# Patient Record
Sex: Female | Born: 1960
Health system: Southern US, Community
[De-identification: ages and names within clinical notes are randomized; demographics above are authoritative.]

## PROBLEM LIST (undated history)

## (undated) DIAGNOSIS — E039 Hypothyroidism, unspecified: Secondary | ICD-10-CM

## (undated) DIAGNOSIS — E7800 Pure hypercholesterolemia, unspecified: Secondary | ICD-10-CM

## (undated) DIAGNOSIS — F32A Depression, unspecified: Secondary | ICD-10-CM

## (undated) DIAGNOSIS — F329 Major depressive disorder, single episode, unspecified: Secondary | ICD-10-CM

## (undated) DIAGNOSIS — K219 Gastro-esophageal reflux disease without esophagitis: Secondary | ICD-10-CM

## (undated) HISTORY — PX: BACK SURGERY: SHX140

## (undated) HISTORY — PX: GASTRIC BYPASS: SHX52

## (undated) HISTORY — PX: BREAST REDUCTION SURGERY: SHX8

## (undated) HISTORY — PX: ABDOMINOPLASTY: SUR9

---

## 2013-07-12 ENCOUNTER — Ambulatory Visit: Payer: Self-pay

## 2013-07-12 LAB — RAPID STREP-A WITH REFLX: Micro Text Report: NEGATIVE

## 2013-07-15 LAB — BETA STREP CULTURE(ARMC)

## 2013-10-22 ENCOUNTER — Ambulatory Visit: Payer: Self-pay | Admitting: Physician Assistant

## 2014-04-20 DIAGNOSIS — R9389 Abnormal findings on diagnostic imaging of other specified body structures: Secondary | ICD-10-CM | POA: Insufficient documentation

## 2014-04-20 DIAGNOSIS — F319 Bipolar disorder, unspecified: Secondary | ICD-10-CM | POA: Insufficient documentation

## 2014-04-20 DIAGNOSIS — G4700 Insomnia, unspecified: Secondary | ICD-10-CM | POA: Insufficient documentation

## 2014-04-20 DIAGNOSIS — F32A Depression, unspecified: Secondary | ICD-10-CM | POA: Insufficient documentation

## 2014-08-01 DIAGNOSIS — G3184 Mild cognitive impairment, so stated: Secondary | ICD-10-CM | POA: Insufficient documentation

## 2014-11-11 ENCOUNTER — Ambulatory Visit
Admit: 2014-11-11 | Disposition: A | Discharge status: Home / Self Care | Payer: Self-pay | Attending: Family Medicine | Admitting: Family Medicine

## 2014-11-11 LAB — COMPREHENSIVE METABOLIC PANEL
ALT: 25 U/L
AST: 30 U/L
Albumin: 3.5 g/dL
Alkaline Phosphatase: 86 U/L
Anion Gap: 8 (ref 7–16)
BUN: 22 mg/dL — AB
Bilirubin,Total: 0.5 mg/dL
CHLORIDE: 105 mmol/L
CO2: 25 mmol/L
Calcium, Total: 8.7 mg/dL — ABNORMAL LOW
Creatinine: 0.79 mg/dL
Glucose: 131 mg/dL — ABNORMAL HIGH
Potassium: 3.7 mmol/L
SODIUM: 138 mmol/L
Total Protein: 6.3 g/dL — ABNORMAL LOW

## 2014-11-11 LAB — CBC WITH DIFFERENTIAL/PLATELET
BASOS ABS: 0 10*3/uL (ref 0.0–0.1)
Basophil %: 0.4 %
Eosinophil #: 0.1 10*3/uL (ref 0.0–0.7)
Eosinophil %: 1 %
HCT: 39.3 % (ref 35.0–47.0)
HGB: 13.3 g/dL (ref 12.0–16.0)
LYMPHS ABS: 0.7 10*3/uL — AB (ref 1.0–3.6)
LYMPHS PCT: 8.5 %
MCH: 31 pg (ref 26.0–34.0)
MCHC: 33.8 g/dL (ref 32.0–36.0)
MCV: 92 fL (ref 80–100)
MONOS PCT: 2.2 %
Monocyte #: 0.2 x10 3/mm (ref 0.2–0.9)
NEUTROS ABS: 7.2 10*3/uL — AB (ref 1.4–6.5)
Neutrophil %: 87.9 %
PLATELETS: 253 10*3/uL (ref 150–440)
RBC: 4.28 10*6/uL (ref 3.80–5.20)
RDW: 14.3 % (ref 11.5–14.5)
WBC: 8.2 10*3/uL (ref 3.6–11.0)

## 2015-02-22 ENCOUNTER — Ambulatory Visit
Admission: EM | Admit: 2015-02-22 | Discharge: 2015-02-22 | Disposition: A | Discharge status: Home / Self Care | Payer: BLUE CROSS/BLUE SHIELD | Attending: Internal Medicine | Admitting: Internal Medicine

## 2015-02-22 DIAGNOSIS — H9201 Otalgia, right ear: Secondary | ICD-10-CM | POA: Diagnosis not present

## 2015-02-22 HISTORY — DX: Gastro-esophageal reflux disease without esophagitis: K21.9

## 2015-02-22 HISTORY — DX: Pure hypercholesterolemia, unspecified: E78.00

## 2015-02-22 HISTORY — DX: Depression, unspecified: F32.A

## 2015-02-22 HISTORY — DX: Major depressive disorder, single episode, unspecified: F32.9

## 2015-02-22 HISTORY — DX: Hypothyroidism, unspecified: E03.9

## 2015-02-22 MED ORDER — PREDNISONE 50 MG PO TABS: 50.0000 mg | ORAL_TABLET | Freq: Every day | ORAL | Status: DC

## 2015-02-22 MED ORDER — DOXYCYCLINE HYCLATE 100 MG PO CAPS: 100.0000 mg | ORAL_CAPSULE | Freq: Two times a day (BID) | ORAL | Status: DC

## 2015-02-22 NOTE — ED Notes (Signed)
C/o right ear pain x 6 days with pain below the ear. States has had "many ear infections"

## 2015-02-22 NOTE — ED Provider Notes (Signed)
CSN: [id number]     Arrival date & time 02/22/15  1703 History   First MD Initiated Contact with Patient 02/22/15 1844     Chief Complaint  Patient presents with  . Otalgia   HPI Patient is a 54 year old lady who presents today with a 6 day history of right earache that has become severe in the last day or so. She says that it hurts all the time, but she has episodes of severe stabbing pain that are very distracting. 800 mg ibuprofen of some help. No fever. Pain radiates forward over the upper right part of the face and head. No rash. Patient has had a shingles vaccine. No drainage from the ear, although she feels like there might be some postnasal drainage. No loss of hearing, no ringing in the ear. Does not feel congested. Throat is a little bit scratchy, not really sore. No nausea/vomiting/diarrhea. She is not achy, and feels well otherwise. She is traveling out of town in 2 days.  Past Medical History  Diagnosis Date  . Depression   . Hypothyroidism   . Hypercholesteremia   . GERD (gastroesophageal reflux disease)    Past Surgical History  Procedure Laterality Date  . Gastric bypass      2006  . Breast reduction surgery     Family History  Problem Relation Age of Onset  . Cancer Mother   . Cancer Father    Social History  Substance Use Topics  . Smoking status: Former Games developer  . Smokeless tobacco: None  . Alcohol Use: Yes     Comment: socially           Review of Systems  All other systems reviewed and are negative.   Allergies  Codeine; Morphine and related; and Ciprofloxacin  Home Medications   Prior to Admission medications   Medication Sig Start Date End Date Taking? Authorizing Provider  atorvastatin (LIPITOR) 40 MG tablet Take 40 mg by mouth daily.   Yes Historical Provider, MD  calcium-vitamin D (OSCAL WITH D) 250-125 MG-UNIT per tablet Take 1 tablet by mouth daily.   Yes Historical Provider, MD  esomeprazole (NEXIUM) 40 MG capsule Take 40 mg by mouth  daily at 12 noon.   Yes Historical Provider, MD  lamoTRIgine (LAMICTAL) 150 MG tablet Take 300 mg by mouth daily.   Yes Historical Provider, MD  levothyroxine (SYNTHROID, LEVOTHROID) 125 MCG tablet Take 125 mcg by mouth daily before breakfast.   Yes Historical Provider, MD  Multiple Vitamin (MULTIVITAMIN) capsule Take 1 capsule by mouth daily.   Yes Historical Provider, MD  traZODone (DESYREL) 50 MG tablet Take 25 mg by mouth at bedtime.   Yes Historical Provider, MD  venlafaxine (EFFEXOR) 100 MG tablet Take 187.5 mg by mouth 1 day or 1 dose.   Yes Historical Provider, MD                 BP 116/82 mmHg  Pulse 73  Temp(Src) 96.9 F (36.1 C) (Tympanic)  Resp 18  Ht  (1.676 m)  Wt 148 lb (67.132 kg)  BMI 23.90 kg/m2  SpO2 100% Physical Exam  Constitutional: She is oriented to person, place, and time. No distress.  Alert, nicely groomed  HENT:  Head: Atraumatic.  Bilateral TMs are mildly dull, no erythema There is discomfort with manipulation of the right outer ear, but not the left Floor of the right ear canal may be very slightly inflamed/red, but no debris in the canal No rash on the  right side of the face or scalp Mild nasal congestion Throat is somewhat red   Eyes:  Conjugate gaze, no eye redness/drainage  Neck: Neck supple.  Cardiovascular: Normal rate and regular rhythm.   Pulmonary/Chest: No respiratory distress. She has no wheezes. She has no rales.  Lungs clear, symmetric breath sounds  Abdominal: She exhibits no distension.  Musculoskeletal: Normal range of motion.  No leg swelling  Neurological: She is alert and oriented to person, place, and time.  Skin: Skin is warm and dry.  No cyanosis  Nursing note and vitals reviewed.   ED Course  Procedures  None MDM   1. Acute otalgia, right    differential diagnosis includes upper respiratory infection, sinus or ear infection, early shingles, temporal arteritis, tic douloureux.  Discharge Medication List as  of 02/22/2015  6:58 PM    START taking these medications   Details  doxycycline (VIBRAMYCIN) 100 MG capsule Take 1 capsule (100 mg total) by mouth 2 (two) times daily., Starting 02/22/2015, Until Discontinued, Normal    predniSONE (DELTASONE) 50 MG tablet Take 1 tablet (50 mg total) by mouth daily., Starting 02/22/2015, Until Discontinued, Normal       Prescriptions as above sent to the pharmacy. Patient should follow-up quickly if rash develops area of headache, as treatment for shingles is most effective this started within 1-2 days of the rash appearance.     Eustace Moore, MD 02/22/15 [phone]

## 2015-02-22 NOTE — Discharge Instructions (Signed)
Prescriptions for prednisone and doxycycline (antibiotic) were sent to the pharmacy, for possible ear infection. Shingles can also cause R head/facial pain, and the pain may start several days before rash appears.   Treatment for shingles is most effective if started within 1-2 days of the rash appearing.

## 2016-03-22 ENCOUNTER — Encounter: Payer: Self-pay | Admitting: Gynecology

## 2016-03-22 ENCOUNTER — Ambulatory Visit
Admission: EM | Admit: 2016-03-22 | Discharge: 2016-03-22 | Disposition: A | Discharge status: Home / Self Care | Payer: BLUE CROSS/BLUE SHIELD | Attending: Emergency Medicine | Admitting: Emergency Medicine

## 2016-03-22 DIAGNOSIS — N390 Urinary tract infection, site not specified: Secondary | ICD-10-CM

## 2016-03-22 LAB — URINALYSIS COMPLETE WITH MICROSCOPIC (ARMC ONLY)
Bilirubin Urine: NEGATIVE
Glucose, UA: NEGATIVE mg/dL
Ketones, ur: NEGATIVE mg/dL
Nitrite: NEGATIVE
Protein, ur: NEGATIVE mg/dL
Specific Gravity, Urine: 1.02 (ref 1.005–1.030)
pH: 5.5 (ref 5.0–8.0)

## 2016-03-22 MED ORDER — NITROFURANTOIN MONOHYD MACRO 100 MG PO CAPS: 100.0000 mg | ORAL_CAPSULE | Freq: Two times a day (BID) | ORAL | 0 refills | Status: DC

## 2016-03-22 NOTE — ED Provider Notes (Signed)
CSN: [id number]     Arrival date & time 03/22/16  1541 History   First MD Initiated Contact with Patient 03/22/16 1631     Chief Complaint  Patient presents with  . Urinary Tract Infection   (Consider location/radiation/quality/duration/timing/severity/associated sxs/prior Treatment) HPI  This 55 year old female who presents with symptoms that she thinks is a UTI. Is been having urgency frequency but not exactly dysuria saying that is more of an itching sensation. She denies any vaginal discharge. States this is been a long time since her previous urinary tract infections. She denies any fever or chills. She has had some back pain but that is chronic. Symptoms have been present for 4 days.      Past Medical History:  Diagnosis Date  . Depression   . GERD (gastroesophageal reflux disease)   . Hypercholesteremia   . Hypothyroidism    Past Surgical History:  Procedure Laterality Date  . BREAST REDUCTION SURGERY    . GASTRIC BYPASS     2006   Family History  Problem Relation Age of Onset  . Cancer Mother   . Cancer Father    Social History  Substance Use Topics  . Smoking status: Former Games developer  . Smokeless tobacco: Never Used  . Alcohol use Yes     Comment: socially   OB History    No data available     Review of Systems  Constitutional: Negative for activity change, appetite change, chills, fatigue and fever.  Genitourinary: Positive for decreased urine volume, frequency and urgency. Negative for vaginal bleeding, vaginal discharge and vaginal pain.  All other systems reviewed and are negative.   Allergies  Codeine; Morphine and related; and Ciprofloxacin  Home Medications   Prior to Admission medications   Medication Sig Start Date End Date Taking? Authorizing Provider  vortioxetine HBr (TRINTELLIX) 20 MG TABS Take by mouth.   Yes Historical Provider, MD  atorvastatin (LIPITOR) 40 MG tablet Take 40 mg by mouth daily.    Historical Provider, MD  calcium-vitamin  D (OSCAL WITH D) 250-125 MG-UNIT per tablet Take 1 tablet by mouth daily.    Historical Provider, MD  doxycycline (VIBRAMYCIN) 100 MG capsule Take 1 capsule (100 mg total) by mouth 2 (two) times daily. 02/22/15   Eustace Moore, MD  esomeprazole (NEXIUM) 40 MG capsule Take 40 mg by mouth daily at 12 noon.    Historical Provider, MD  lamoTRIgine (LAMICTAL) 150 MG tablet Take 300 mg by mouth daily.    Historical Provider, MD  levothyroxine (SYNTHROID, LEVOTHROID) 125 MCG tablet Take 125 mcg by mouth daily before breakfast.    Historical Provider, MD  Multiple Vitamin (MULTIVITAMIN) capsule Take 1 capsule by mouth daily.    Historical Provider, MD  nitrofurantoin, macrocrystal-monohydrate, (MACROBID) 100 MG capsule Take 1 capsule (100 mg total) by mouth 2 (two) times daily. 03/22/16   Lutricia Feil, PA-C  predniSONE (DELTASONE) 50 MG tablet Take 1 tablet (50 mg total) by mouth daily. 02/22/15   Eustace Moore, MD  traZODone (DESYREL) 50 MG tablet Take 25 mg by mouth at bedtime.    Historical Provider, MD  venlafaxine (EFFEXOR) 100 MG tablet Take 187.5 mg by mouth 1 day or 1 dose.    Historical Provider, MD   Meds Ordered and Administered this Visit  Medications - No data to display  BP 114/79 (BP Location: Left Arm)   Pulse 71   Temp 98.8 F (37.1 C) (Oral)   Resp 16   Ht 5'  6" (1.676 m)   Wt 146 lb (66.2 kg)   SpO2 99%   BMI 23.57 kg/m  No data found.   Physical Exam  Constitutional: She is oriented to person, place, and time. She appears well-developed and well-nourished. No distress.  HENT:  Head: Normocephalic and atraumatic.  Eyes: EOM are normal. Pupils are equal, round, and reactive to light.  Neck: Normal range of motion. Neck supple.  Abdominal: Soft. Bowel sounds are normal. She exhibits no distension and no mass. There is no tenderness. There is no rebound and no guarding.  Musculoskeletal: Normal range of motion.  Neurological: She is alert and oriented to person, place,  and time.  Skin: Skin is warm and dry. She is not diaphoretic.  Psychiatric: She has a normal mood and affect. Her behavior is normal. Judgment and thought content normal.  Nursing note and vitals reviewed.   Urgent Care Course   Clinical Course    Procedures (including critical care time)  Labs Review Labs Reviewed  URINALYSIS COMPLETEWITH MICROSCOPIC ([facility] ONLY) - Abnormal; Notable for the following:       Result Value   APPearance HAZY (*)    Hgb urine dipstick MODERATE (*)    Leukocytes, UA MODERATE (*)    Bacteria, UA FEW (*)    Squamous Epithelial / LPF 6-30 (*)    All other components within normal limits  URINE CULTURE    Imaging Review No results found.   Visual Acuity Review  Right Eye Distance:   Left Eye Distance:   Bilateral Distance:    Right Eye Near:   Left Eye Near:    Bilateral Near:         MDM   1. UTI (lower urinary tract infection)    Discharge Medication List as of 03/22/2016  4:41 PM    START taking these medications   Details  nitrofurantoin, macrocrystal-monohydrate, (MACROBID) 100 MG capsule Take 1 capsule (100 mg total) by mouth 2 (two) times daily., Starting Sat 03/22/2016, Normal      Plan: 1. Test/x-ray results and diagnosis reviewed with patient 2. rx as per orders; risks, benefits, potential side effects reviewed with patient 3. Recommend supportive treatment with Increased hydration. Urine culture and sensitivities will be available in 48 hours. 4. F/u prn if symptoms worsen or don't improve     Lutricia FeilWilliam P Cloyce Blankenhorn, PA-C 03/22/16 1749

## 2016-03-22 NOTE — ED Triage Notes (Signed)
Patient c/o having the urge to void x 4 days.

## 2016-03-25 LAB — URINE CULTURE: Culture: 30000 — AB

## 2016-03-28 ENCOUNTER — Encounter: Payer: Self-pay | Admitting: Emergency Medicine

## 2016-03-28 ENCOUNTER — Ambulatory Visit
Admission: EM | Admit: 2016-03-28 | Discharge: 2016-03-28 | Disposition: A | Discharge status: Home / Self Care | Payer: BLUE CROSS/BLUE SHIELD | Attending: Emergency Medicine | Admitting: Emergency Medicine

## 2016-03-28 DIAGNOSIS — B9789 Other viral agents as the cause of diseases classified elsewhere: Principal | ICD-10-CM

## 2016-03-28 DIAGNOSIS — J069 Acute upper respiratory infection, unspecified: Secondary | ICD-10-CM

## 2016-03-28 LAB — RAPID STREP SCREEN (MED CTR MEBANE ONLY): Streptococcus, Group A Screen (Direct): NEGATIVE

## 2016-03-28 MED ORDER — CETIRIZINE-PSEUDOEPHEDRINE ER 5-120 MG PO TB12: 1.0000 | ORAL_TABLET | Freq: Two times a day (BID) | ORAL | 0 refills | Status: DC

## 2016-03-28 MED ORDER — AMOXICILLIN-POT CLAVULANATE 875-125 MG PO TABS: 1.0000 | ORAL_TABLET | Freq: Two times a day (BID) | ORAL | 0 refills | Status: DC

## 2016-03-28 NOTE — ED Triage Notes (Signed)
Patient c/o cough and chest congestion and sore throat for 2 days. Patient denies fevers.

## 2016-03-28 NOTE — ED Provider Notes (Signed)
CSN: [id number]     Arrival date & time 03/28/16  1638 History   None    Chief Complaint  Patient presents with  . Cough   (Consider location/radiation/quality/duration/timing/severity/associated sxs/prior Treatment) HPI This 55 year old female who was recently here for a UTI now presents with a 2 day history of the sudden onset of extremely sore throat is very painful to swallow and a nonproductive cough. Said no fever or chills. He is [place] a [place] to [place] starting tomorrow will be gone all week long. Addition to her sore throat and cough she has had fatigue.     Past Medical History:  Diagnosis Date  . Depression   . GERD (gastroesophageal reflux disease)   . Hypercholesteremia   . Hypothyroidism    Past Surgical History:  Procedure Laterality Date  . BREAST REDUCTION SURGERY    . GASTRIC BYPASS     2006   Family History  Problem Relation Age of Onset  . Cancer Mother   . Cancer Father    Social History  Substance Use Topics  . Smoking status: Former Games developermoker  . Smokeless tobacco: Never Used  . Alcohol use Yes     Comment: socially   OB History    No data available     Review of Systems  Constitutional: Positive for fatigue. Negative for chills and fever.  HENT: Positive for congestion, postnasal drip and sore throat.   Respiratory: Positive for cough. Negative for shortness of breath, wheezing and stridor.   All other systems reviewed and are negative.   Allergies  Codeine; Morphine and related; and Ciprofloxacin  Home Medications   Prior to Admission medications   Medication Sig Start Date End Date Taking? Authorizing Provider  amoxicillin-clavulanate (AUGMENTIN) 875-125 MG tablet Take 1 tablet by mouth every 12 (twelve) hours. 03/28/16   Lutricia FeilWilliam P Roemer, PA-C  atorvastatin (LIPITOR) 40 MG tablet Take 40 mg by mouth daily.    Historical Provider, MD  calcium-vitamin D (OSCAL WITH D) 250-125 MG-UNIT per tablet Take 1 tablet by mouth  daily.    Historical Provider, MD  cetirizine-pseudoephedrine (ZYRTEC-D) 5-120 MG tablet Take 1 tablet by mouth 2 (two) times daily. 03/28/16   Lutricia FeilWilliam P Roemer, PA-C  esomeprazole (NEXIUM) 40 MG capsule Take 40 mg by mouth daily at 12 noon.    Historical Provider, MD  lamoTRIgine (LAMICTAL) 150 MG tablet Take 300 mg by mouth daily.    Historical Provider, MD  levothyroxine (SYNTHROID, LEVOTHROID) 125 MCG tablet Take 125 mcg by mouth daily before breakfast.    Historical Provider, MD  Multiple Vitamin (MULTIVITAMIN) capsule Take 1 capsule by mouth daily.    Historical Provider, MD  predniSONE (DELTASONE) 50 MG tablet Take 1 tablet (50 mg total) by mouth daily. 02/22/15   Eustace MooreLaura W Murray, MD  traZODone (DESYREL) 50 MG tablet Take 25 mg by mouth at bedtime.    Historical Provider, MD  venlafaxine (EFFEXOR) 100 MG tablet Take 187.5 mg by mouth 1 day or 1 dose.    Historical Provider, MD  vortioxetine HBr (TRINTELLIX) 20 MG TABS Take by mouth.    Historical Provider, MD   Meds Ordered and Administered this Visit  Medications - No data to display  BP 129/86 (BP Location: Left Arm)   Pulse 71   Temp 97.1 F (36.2 C) (Tympanic)   Resp 16   Ht 5\' 6"  (1.676 m)   Wt 146 lb (66.2 kg)   SpO2 97%   BMI 23.57  kg/m  No data found.   Physical Exam  Constitutional: She is oriented to person, place, and time. She appears well-developed and well-nourished. No distress.  HENT:  Head: Normocephalic and atraumatic.  Right Ear: External ear normal.  Left Ear: External ear normal.  Nose: Nose normal.  Mouth/Throat: Oropharynx is clear and moist.  Eyes: EOM are normal. Pupils are equal, round, and reactive to light. Right eye exhibits no discharge. Left eye exhibits no discharge.  Neck: Normal range of motion. Neck supple.  Musculoskeletal: Normal range of motion.  Lymphadenopathy:    She has no cervical adenopathy.  Neurological: She is alert and oriented to person, place, and time.  Skin: Skin is warm  and dry. She is not diaphoretic.  Nursing note and vitals reviewed.   Urgent Care Course   Clinical Course    Procedures (including critical care time)  Labs Review Labs Reviewed  RAPID STREP SCREEN (NOT AT [facility])  CULTURE, GROUP A STREP [facility])    Imaging Review No results found.   Visual Acuity Review  Right Eye Distance:   Left Eye Distance:   Bilateral Distance:    Right Eye Near:   Left Eye Near:    Bilateral Near:         MDM   1. Viral URI with cough    New Prescriptions   AMOXICILLIN-CLAVULANATE (AUGMENTIN) 875-125 MG TABLET    Take 1 tablet by mouth every 12 (twelve) hours.   CETIRIZINE-PSEUDOEPHEDRINE (ZYRTEC-D) 5-120 MG TABLET    Take 1 tablet by mouth 2 (two) times daily.  Plan: 1. Test/x-ray results and diagnosis reviewed with patient 2. rx as per orders; risks, benefits, potential side effects reviewed with patient 3. Recommend supportive treatment with Flonase daily for 3-4 weeks for drainage. The patient is going out of this a business trip until next week I have advised her that Zyrtec-D may help she may tolerate the flight better than when she was congested. So because she'll be on the trip I will give her a prescription for Augmentin and she can use if it is not improving within several days. Do not use the medication if the illness seems to be improving since this is highly likely to be a viral illness. Ulcers and sensitivities of the throat swab will be available in 48 hours. If she continues to have problems or is not improving she should follow-up with her primary care physician 4. F/u prn if symptoms worsen or don't improve     Lutricia Feil, PA-C 03/28/16 1726

## 2016-03-31 LAB — CULTURE, GROUP A STREP (THRC)

## 2019-12-14 ENCOUNTER — Encounter: Payer: Self-pay | Admitting: Emergency Medicine

## 2019-12-14 ENCOUNTER — Ambulatory Visit
Admission: EM | Admit: 2019-12-14 | Discharge: 2019-12-14 | Disposition: A | Discharge status: Home / Self Care | Payer: Managed Care, Other (non HMO) | Attending: Family Medicine | Admitting: Family Medicine

## 2019-12-14 ENCOUNTER — Other Ambulatory Visit: Payer: Self-pay

## 2019-12-14 DIAGNOSIS — H6691 Otitis media, unspecified, right ear: Secondary | ICD-10-CM

## 2019-12-14 DIAGNOSIS — J069 Acute upper respiratory infection, unspecified: Secondary | ICD-10-CM

## 2019-12-14 MED ORDER — BENZONATATE 100 MG PO CAPS: 100.0000 mg | ORAL_CAPSULE | Freq: Three times a day (TID) | ORAL | 0 refills | Status: DC | PRN

## 2019-12-14 MED ORDER — AMOXICILLIN-POT CLAVULANATE 875-125 MG PO TABS: 1.0000 | ORAL_TABLET | Freq: Two times a day (BID) | ORAL | 0 refills | Status: DC

## 2019-12-14 MED ORDER — HYDROCOD POLST-CPM POLST ER 10-8 MG/5ML PO SUER: 5.0000 mL | Freq: Every evening | ORAL | 0 refills | Status: DC | PRN

## 2019-12-14 NOTE — Discharge Instructions (Addendum)
Take medication as prescribed. Rest. Drink plenty of fluids. Monitor.  Over-the-counter Mucinex, Tylenol ibuprofen as needed.  Follow up with your primary care physician this week as needed. Return to Urgent care for new or worsening concerns.

## 2019-12-14 NOTE — ED Provider Notes (Signed)
[facility] ____________________________________________  Time seen: Approximately 4:09 PM  I have reviewed the triage vital signs and the nursing notes.   HISTORY  Chief Complaint Cough, Headache, and Nasal Congestion   HPI Summer Ware is a 59 y.o. female presenting for evaluation of 3 days of nasal congestion, nasal drainage and cough. Reports accompanying sore throat. Has had a few episodes of posttussive emesis. Denies diarrhea, changes in taste or smell, shortness of breath or chest pain. Does feel sore from coughing. Overall still drinking fluids well. Has taken over-the-counter decongestant without resolution. States temperature is elevated past 100 degrees. Also reports accompanying headache, described as frontal headache pressure and congestion. Prior to arrival today she did go to [facility] and have a COVID-19 test, pending result. Has had both COVID-19 vaccines. Denies known sick contacts. Some bilateral ear discomfort. Denies other aggravating alleviating factors. Reports otherwise doing well. Denies recent sickness.  Mentock, Loma Newton, MD : PCP  Past Medical History:  Diagnosis Date   Depression    GERD (gastroesophageal reflux disease)    Hypercholesteremia    Hypothyroidism     There are no problems to display for this patient.   Past Surgical History:  Procedure Laterality Date   BREAST REDUCTION SURGERY     GASTRIC BYPASS     2006     No current facility-administered medications for this encounter.  Current Outpatient Medications:    atorvastatin (LIPITOR) 40 MG tablet, Take 40 mg by mouth daily., Disp: , Rfl:    calcium-vitamin D (OSCAL WITH D) 250-125 MG-UNIT per tablet, Take 1 tablet by mouth daily., Disp: , Rfl:    esomeprazole (NEXIUM) 40 MG capsule, Take 40 mg by mouth daily at 12 noon., Disp: , Rfl:    lamoTRIgine (LAMICTAL) 150 MG tablet, Take 300 mg by mouth daily., Disp: , Rfl:    levothyroxine (SYNTHROID,  LEVOTHROID) 125 MCG tablet, Take 125 mcg by mouth daily before breakfast., Disp: , Rfl:    Multiple Vitamin (MULTIVITAMIN) capsule, Take 1 capsule by mouth daily., Disp: , Rfl:    vortioxetine HBr (TRINTELLIX) 20 MG TABS, Take by mouth., Disp: , Rfl:    amoxicillin-clavulanate (AUGMENTIN) 875-125 MG tablet, Take 1 tablet by mouth every 12 (twelve) hours., Disp: 20 tablet, Rfl: 0   benzonatate (TESSALON PERLES) 100 MG capsule, Take 1 capsule (100 mg total) by mouth 3 (three) times daily as needed for cough., Disp: 15 capsule, Rfl: 0   chlorpheniramine-HYDROcodone (TUSSIONEX PENNKINETIC ER) 10-8 MG/5ML SUER, Take 5 mLs by mouth at bedtime as needed. do not drive or operate machinery while taking as can cause drowsiness., Disp: 50 mL, Rfl: 0  Allergies Codeine, Morphine and related, and Ciprofloxacin  Family History  Problem Relation Age of Onset   Cancer Mother    Cancer Father     Social History Social History   Tobacco Use   Smoking status: Former Smoker   Smokeless tobacco: Never Used  Substance Use Topics   Alcohol use: Yes    Comment: socially   Drug use: No    Review of Systems Constitutional: Positive fever. ENT: Positive sore throat. Positive congestion. Cardiovascular: Denies chest pain. Respiratory: Denies shortness of breath. Gastrointestinal: No abdominal pain.  Musculoskeletal: Negative for back pain. Skin: Negative for rash.   ____________________________________________   PHYSICAL EXAM:  VITAL SIGNS: ED Triage Vitals  Enc Vitals Group     BP 12/14/19 1524 124/72     Pulse Rate 12/14/19 1524 82  Resp 12/14/19 1524 18     Temp 12/14/19 1524 98.8 F (37.1 C)     Temp Source 12/14/19 1524 Oral     SpO2 12/14/19 1524 98 %     Weight 12/14/19 1522 130 lb (59 kg)     Height 12/14/19 1522 5\' 7"  (1.702 m)     Head Circumference --      Peak Flow --      Pain Score 12/14/19 1521 7     Pain Loc --      Pain Edu? --      Excl. in [place]? --      Constitutional: Alert and oriented. Well appearing and in no acute distress. Eyes: Conjunctivae are normal.  Head: Atraumatic. No sinus tenderness to palpation. No swelling. No erythema.  Ears: Left: Nontender, normal canal, no erythema, normal TM. Right: Nontender, normal canal, moderate erythema and dull TM.  Nose:Nasal congestion with clear rhinorrhea  Mouth/Throat: Mucous membranes are moist.no pharyngeal erythema. No tonsillar swelling or exudate.  Neck: No stridor.  No cervical spine tenderness to palpation. Hematological/Lymphatic/Immunilogical: No cervical lymphadenopathy. Cardiovascular: Normal rate, regular rhythm. Grossly normal heart sounds.  Good peripheral circulation. Respiratory: Normal respiratory effort.  No retractions. No wheezes, rales or rhonchi. Good air movement. Dry intermittent cough in room. Musculoskeletal: Ambulatory with steady gait.  Neurologic:  Normal speech and language. No gait instability. Skin:  Skin appears warm, dry and intact. No rash noted. Psychiatric: Mood and affect are normal. Speech and behavior are normal.  ___________________________________________   LABS (all labs ordered are listed, but only abnormal results are displayed)  Labs Reviewed - No data to display ____________________________________________    PROCEDURES Procedures     INITIAL IMPRESSION / ASSESSMENT AND PLAN / ED COURSE  Pertinent labs & imaging results that were available during my care of the patient were reviewed by me and considered in my medical decision making (see chart for details).  Well-appearing patient. No acute distress. Suspect viral illness. Patient had COVID-19 testing completed today at outside source. Right otitis. Will treat with oral Augmentin, as needed Tessalon Perles appearing Tussionex. Over-the-counter Mucinex, Tylenol ibuprofen as needed. Rest, fluids, supportive care. Discussed follow up and return parameters including no resolution or  any worsening concerns. Patient verbalized understanding and agreed to plan.   ____________________________________________   FINAL CLINICAL IMPRESSION(S) / ED DIAGNOSES  Final diagnoses:  Right otitis media, unspecified otitis media type  Viral URI with cough     ED Discharge Orders         Ordered    amoxicillin-clavulanate (AUGMENTIN) 875-125 MG tablet  Every 12 hours     12/14/19 1608    benzonatate (TESSALON PERLES) 100 MG capsule  3 times daily PRN     12/14/19 1608    chlorpheniramine-HYDROcodone (TUSSIONEX PENNKINETIC ER) 10-8 MG/5ML SUER  At bedtime PRN     12/14/19 1608           Note: This dictation was prepared with Dragon dictation along with smaller phrase technology. Any transcriptional errors that result from this process are unintentional.         Marylene Land, NP 12/14/19 1624

## 2019-12-14 NOTE — ED Triage Notes (Addendum)
Patient c/o cough, headache, nasal congestion and low grade fever x 3 days. Patient declines testing for COVID. States she has had both of her vaccines.

## 2019-12-20 ENCOUNTER — Ambulatory Visit (INDEPENDENT_AMBULATORY_CARE_PROVIDER_SITE_OTHER): Payer: Managed Care, Other (non HMO)

## 2019-12-20 ENCOUNTER — Other Ambulatory Visit: Payer: Self-pay

## 2019-12-20 ENCOUNTER — Ambulatory Visit
Admission: EM | Admit: 2019-12-20 | Discharge: 2019-12-20 | Disposition: A | Discharge status: Home / Self Care | Payer: Managed Care, Other (non HMO) | Attending: Emergency Medicine | Admitting: Emergency Medicine

## 2019-12-20 ENCOUNTER — Encounter: Payer: Self-pay | Admitting: Emergency Medicine

## 2019-12-20 DIAGNOSIS — R059 Cough, unspecified: Secondary | ICD-10-CM

## 2019-12-20 DIAGNOSIS — J9801 Acute bronchospasm: Secondary | ICD-10-CM

## 2019-12-20 DIAGNOSIS — R05 Cough: Secondary | ICD-10-CM | POA: Diagnosis not present

## 2019-12-20 MED ORDER — ALBUTEROL SULFATE HFA 108 (90 BASE) MCG/ACT IN AERS: 2.0000 | INHALATION_SPRAY | Freq: Four times a day (QID) | RESPIRATORY_TRACT | 0 refills | Status: DC | PRN

## 2019-12-20 MED ORDER — HYDROCOD POLST-CPM POLST ER 10-8 MG/5ML PO SUER: 5.0000 mL | Freq: Every evening | ORAL | 0 refills | Status: DC | PRN

## 2019-12-20 MED ORDER — PREDNISONE 10 MG PO TABS: ORAL_TABLET | ORAL | 0 refills | Status: DC

## 2019-12-20 NOTE — ED Triage Notes (Signed)
Patient was seen here on 06/02. She is here because she is continuing to have coughing spells. She is still on her antibiotic.

## 2019-12-20 NOTE — Discharge Instructions (Addendum)
Take medication as prescribed. Rest. Drink plenty of fluids.  Continue current antibiotic.  Follow up with your primary care physician this week as needed. Return to Urgent care for new or worsening concerns.

## 2019-12-20 NOTE — ED Provider Notes (Signed)
[facility] ____________________________________________  Time seen: Approximately 7:23 PM  I have reviewed the triage vital signs and the nursing notes.   HISTORY  Chief Complaint Cough   HPI Summer Ware is a 59 y.o. female presenting for evaluation of continued cough.  Patient was seen in urgent care on 12/14/2019 for cough, congestion and ear complaints.  Reports overall she has been coughing for just over 1 week.  States that she has had a few episodes of posttussive emesis.  Patient reports after recent urgent care visit she was feeling better and started to improve, but reports today she coughed several times back to back causing her to vomit again.  Denies any other episodes of vomiting or abdominal complaints.  Continues eat and drink well.  Denies any fevers.  Covid test that she had at [facility] this past week was negative.  Has had both COVID-19 vaccines.  States no longer having ear pain.  Patient is still on Augmentin.  Has ran out of her cough syrup.  Reports the cough syrup did help her.  Still taking Augmentin and.  Tessalon Perles.  Denies history of recurrent respiratory illnesses.  Denies chest pain, shortness of breath, fevers.  Reports her husband also has had some of the similar sickness.  Mentock, Lake Bells, MD : PCP   Past Medical History:  Diagnosis Date  . Depression   . GERD (gastroesophageal reflux disease)   . Hypercholesteremia   . Hypothyroidism     There are no problems to display for this patient.   Past Surgical History:  Procedure Laterality Date  . BREAST REDUCTION SURGERY    . GASTRIC BYPASS     2006     No current facility-administered medications for this encounter.  Current Outpatient Medications:  .  amoxicillin-clavulanate (AUGMENTIN) 875-125 MG tablet, Take 1 tablet by mouth every 12 (twelve) hours., Disp: 20 tablet, Rfl: 0 .  atorvastatin (LIPITOR) 40 MG tablet, Take 40 mg by mouth daily., Disp: , Rfl:  .   benzonatate (TESSALON PERLES) 100 MG capsule, Take 1 capsule (100 mg total) by mouth 3 (three) times daily as needed for cough., Disp: 15 capsule, Rfl: 0 .  calcium-vitamin D (OSCAL WITH D) 250-125 MG-UNIT per tablet, Take 1 tablet by mouth daily., Disp: , Rfl:  .  esomeprazole (NEXIUM) 40 MG capsule, Take 40 mg by mouth daily at 12 noon., Disp: , Rfl:  .  lamoTRIgine (LAMICTAL) 150 MG tablet, Take 300 mg by mouth daily., Disp: , Rfl:  .  levothyroxine (SYNTHROID, LEVOTHROID) 125 MCG tablet, Take 125 mcg by mouth daily before breakfast., Disp: , Rfl:  .  Multiple Vitamin (MULTIVITAMIN) capsule, Take 1 capsule by mouth daily., Disp: , Rfl:  .  vortioxetine HBr (TRINTELLIX) 20 MG TABS, Take by mouth., Disp: , Rfl:  .  albuterol (VENTOLIN HFA) 108 (90 Base) MCG/ACT inhaler, Inhale 2 puffs into the lungs every 6 (six) hours as needed for wheezing or shortness of breath., Disp: 6.7 g, Rfl: 0 .  chlorpheniramine-HYDROcodone (TUSSIONEX PENNKINETIC ER) 10-8 MG/5ML SUER, Take 5 mLs by mouth at bedtime as needed for cough. do not drive or operate machinery while taking as can cause drowsiness., Disp: 50 mL, Rfl: 0 .  predniSONE (DELTASONE) 10 MG tablet, Start 60 mg po day one, then 50 mg po day two, taper by 10 mg daily until complete., Disp: 21 tablet, Rfl: 0  Allergies Codeine, Morphine and related, and Ciprofloxacin  Family History  Problem Relation Age of  Onset  . Cancer Mother   . Cancer Father     Social History Social History   Tobacco Use  . Smoking status: Former Games developer  . Smokeless tobacco: Never Used  Substance Use Topics  . Alcohol use: Yes    Comment: socially  . Drug use: No    Review of Systems Constitutional: No fever ENT: No sore throat. As above.  Cardiovascular: Denies chest pain. Respiratory: Denies shortness of breath. Gastrointestinal: No abdominal pain.  As above.  Musculoskeletal: Negative for back pain. Skin: Negative for  rash.  ____________________________________________   PHYSICAL EXAM:  VITAL SIGNS: ED Triage Vitals  Enc Vitals Group     BP 12/20/19 1751 (!) 131/98     Pulse Rate 12/20/19 1751 75     Resp 12/20/19 1751 18     Temp 12/20/19 1751 98.3 F (36.8 C)     Temp Source 12/20/19 1751 Oral     SpO2 12/20/19 1751 98 %     Weight 12/20/19 1749 130 lb 1.1 oz (59 kg)     Height 12/20/19 1749 5\' 7"  (1.702 m)     Head Circumference --      Peak Flow --      Pain Score 12/20/19 1749 0     Pain Loc --      Pain Edu? --      Excl. in GC? --    Constitutional: Alert and oriented. Well appearing and in no acute distress. Eyes: Conjunctivae are normal. Head: Atraumatic. No sinus tenderness to palpation. No swelling. No erythema.  Ears: Left: Nontender, normal canal, no erythema, normal TM.  Right: Nontender, normal canal, mild effusion with mild erythema present.  Nose:Nasal congestion  Mouth/Throat: Mucous membranes are moist. No pharyngeal erythema. No tonsillar swelling or exudate.  Neck: No stridor.  No cervical spine tenderness to palpation. Hematological/Lymphatic/Immunilogical: No cervical lymphadenopathy. Cardiovascular: Normal rate, regular rhythm. Grossly normal heart sounds.  Good peripheral circulation. Respiratory: Normal respiratory effort.  No retractions.  Good air movement.  Bilateral base rhonchi with mild wheeze. Gastrointestinal: Soft and nontender.  Musculoskeletal: Ambulatory with steady gait.  Neurologic:  Normal speech and language. No gait instability. Skin:  Skin appears warm, dry and intact. No rash noted. Psychiatric: Mood and affect are normal. Speech and behavior are normal.  ___________________________________________   LABS (all labs ordered are listed, but only abnormal results are displayed)  Labs Reviewed - No data to display ____________________________________________  RADIOLOGY  DG Chest 2 View  Result Date: 12/20/2019 CLINICAL DATA:  Cough for a  few weeks. EXAM: CHEST - 2 VIEW COMPARISON:  None. FINDINGS: Lungs clear. Heart size normal. No pneumothorax or pleural fluid. No acute or focal bony abnormality. IMPRESSION: Negative chest. Electronically Signed   By: 02/19/2020 M.D.   On: 12/20/2019 19:25   ____________________________________________   PROCEDURES Procedures   INITIAL IMPRESSION / ASSESSMENT AND PLAN / ED COURSE  Pertinent labs & imaging results that were available during my care of the patient were reviewed by me and considered in my medical decision making (see chart for details).  Overall well-appearing patient.  No acute distress.  Suspect recent viral illness.  Chest x-ray as above radiologist, negative.  Will treat patient with oral prednisone taper, albuterol inhaler and refill as needed test next.  Continue home Augmentin.  Rest, fluids, supportive care.Discussed indication, risks and benefits of medications with patient.   Discussed follow up with Primary care physician this week. Discussed follow up and return parameters  including no resolution or any worsening concerns. Patient verbalized understanding and agreed to plan.   ____________________________________________   FINAL CLINICAL IMPRESSION(S) / ED DIAGNOSES  Final diagnoses:  Cough  Bronchospasm     ED Discharge Orders         Ordered    chlorpheniramine-HYDROcodone (TUSSIONEX PENNKINETIC ER) 10-8 MG/5ML SUER  At bedtime PRN     12/20/19 1929    predniSONE (DELTASONE) 10 MG tablet     12/20/19 1929    albuterol (VENTOLIN HFA) 108 (90 Base) MCG/ACT inhaler  Every 6 hours PRN     12/20/19 1929           Note: This dictation was prepared with Dragon dictation along with smaller phrase technology. Any transcriptional errors that result from this process are unintentional.         Renford Dills, NP 12/20/19 1956

## 2020-04-02 ENCOUNTER — Other Ambulatory Visit: Payer: Self-pay

## 2020-04-02 ENCOUNTER — Ambulatory Visit
Admission: EM | Admit: 2020-04-02 | Discharge: 2020-04-02 | Disposition: A | Discharge status: Home / Self Care | Payer: Managed Care, Other (non HMO) | Attending: Family Medicine | Admitting: Family Medicine

## 2020-04-02 DIAGNOSIS — S61012A Laceration without foreign body of left thumb without damage to nail, initial encounter: Secondary | ICD-10-CM | POA: Diagnosis not present

## 2020-04-02 MED ORDER — MUPIROCIN 2 % EX OINT: 1.0000 "application " | TOPICAL_OINTMENT | Freq: Two times a day (BID) | CUTANEOUS | 0 refills | Status: AC

## 2020-04-02 NOTE — ED Triage Notes (Signed)
Patient complains of laceration to her left thumb that occurred around 45 mins ago. States that she was using a knife and it slipped.

## 2020-04-02 NOTE — Discharge Instructions (Addendum)
Sutures out in 7 days.   Take care  Dr. Rio Kidane  

## 2020-04-02 NOTE — ED Provider Notes (Signed)
[facility]    CSN: [id number] Arrival date & time: 04/02/20  1724      History   Chief Complaint Chief Complaint  Patient presents with  . Laceration   HPI  59 year old female presents for laceration to her left thumb.  Patient states that she was peeling potatoes and inadvertently cut her left thumb with a knife.  Bleeding is well controlled.  Pain 4/10 in severity.  Last tetanus was in 2012.  Patient believes that she needs sutures.  No other reported symptoms.  No other complaints.  Past Medical History:  Diagnosis Date  . Depression   . GERD (gastroesophageal reflux disease)   . Hypercholesteremia   . Hypothyroidism    Past Surgical History:  Procedure Laterality Date  . BREAST REDUCTION SURGERY    . GASTRIC BYPASS     2006   OB History   No obstetric history on file.    Home Medications    Prior to Admission medications   Medication Sig Start Date End Date Taking? Authorizing Provider  atorvastatin (LIPITOR) 40 MG tablet Take 40 mg by mouth daily.   Yes [provider]  calcium-vitamin D (OSCAL WITH D) 250-125 MG-UNIT per tablet Take 1 tablet by mouth daily.   Yes [provider]  esomeprazole (NEXIUM) 40 MG capsule Take 40 mg by mouth daily at 12 noon.   Yes [provider]  lamoTRIgine (LAMICTAL) 150 MG tablet Take 300 mg by mouth daily.   Yes [provider]  levothyroxine (SYNTHROID, LEVOTHROID) 125 MCG tablet Take 125 mcg by mouth daily before breakfast.   Yes [provider]  Multiple Vitamin (MULTIVITAMIN) capsule Take 1 capsule by mouth daily.   Yes [provider]  vortioxetine HBr (TRINTELLIX) 20 MG TABS Take by mouth.   Yes [provider]  mupirocin ointment (BACTROBAN) 2 % Apply 1 application topically 2 (two) times daily for 7 days. 04/02/20 04/09/20  Tommie Sams, DO  albuterol (VENTOLIN HFA) 108 (90 Base) MCG/ACT inhaler Inhale 2 puffs into the lungs every 6 (six) hours as  needed for wheezing or shortness of breath. 12/20/19 04/02/20  Renford Dills, NP  traZODone (DESYREL) 50 MG tablet Take 25 mg by mouth at bedtime.  12/14/19  [provider]  venlafaxine (EFFEXOR) 100 MG tablet Take 187.5 mg by mouth 1 day or 1 dose.  12/14/19  [provider]    Family History Family History  Problem Relation Age of Onset  . Cancer Mother   . Cancer Father     Social History Social History   Tobacco Use  . Smoking status: Former Games developer  . Smokeless tobacco: Never Used  Vaping Use  . Vaping Use: Never used  Substance Use Topics  . Alcohol use: Yes    Comment: socially  . Drug use: No     Allergies   Codeine, Morphine and related, and Ciprofloxacin   Review of Systems Review of Systems  Skin:       Laceration, left thumb.   Physical Exam Triage Vital Signs ED Triage Vitals  Enc Vitals Group     BP 04/02/20 1752 116/85     Pulse Rate 04/02/20 1752 76     Resp 04/02/20 1752 18     Temp 04/02/20 1752 98.4 F (36.9 C)     Temp Source 04/02/20 1752 Oral     SpO2 04/02/20 1752 99 %     Weight 04/02/20 1749 125 lb (56.7 kg)  Height 04/02/20 1749 5\' 6"  (1.676 m)     Head Circumference --      Peak Flow --      Pain Score 04/02/20 1749 4     Pain Loc --      Pain Edu? --      Excl. in GC? --    Updated Vital Signs BP 116/85 (BP Location: Right Arm)   Pulse 76   Temp 98.4 F (36.9 C) (Oral)   Resp 18   Ht 5\' 6"  (1.676 m)   Wt 56.7 kg   SpO2 99%   BMI 20.18 kg/m   Visual Acuity Right Eye Distance:   Left Eye Distance:   Bilateral Distance:    Right Eye Near:   Left Eye Near:    Bilateral Near:     Physical Exam Constitutional:      General: She is not in acute distress.    Appearance: Normal appearance. She is not ill-appearing.  HENT:     Head: Normocephalic and atraumatic.  Eyes:     General:        Right eye: No discharge.        Left eye: No discharge.     Conjunctiva/sclera: Conjunctivae normal.    Pulmonary:     Effort: Pulmonary effort is normal. No respiratory distress.  Skin:    Comments: Left thumb - 3 cm linear laceration (palmar aspect). No nail involvement.   Neurological:     Mental Status: She is alert.  Psychiatric:        Mood and Affect: Mood normal.        Behavior: Behavior normal.    UC Treatments / Results  Labs (all labs ordered are listed, but only abnormal results are displayed) Labs Reviewed - No data to display  EKG   Radiology No results found.  Procedures Laceration Repair  Date/Time: 04/02/2020 6:55 PM Performed by: , DO Authorized by: 04/04/2020, DO   Consent:    Consent obtained:  Verbal   Consent given by:  Patient Anesthesia (see MAR for exact dosages):    Anesthesia method:  Local infiltration   Local anesthetic:  Lidocaine 1% w/o epi Laceration details:    Location:  Finger   Finger location:  L thumb   Length (cm):  3 Repair type:    Repair type:  Simple Exploration:    Hemostasis achieved with:  Direct pressure   Contaminated: no   Treatment:    Area cleansed with:  Betadine   Amount of cleaning:  Standard   Irrigation solution:  Sterile water   Irrigation method:  Syringe Skin repair:    Repair method:  Sutures   Suture size:  5-0   Suture material:  Prolene   Suture technique:  Simple interrupted   Number of sutures:  6 Approximation:    Approximation:  Close Post-procedure details:    Dressing:  Antibiotic ointment and non-adherent dressing   Patient tolerance of procedure:  Tolerated well, no immediate complications   (including critical care time)  Medications Ordered in UC Medications - No data to display  Initial Impression / Assessment and Plan / UC Course  I have reviewed the triage vital signs and the nursing notes.  Pertinent labs & imaging results that were available during my care of the patient were reviewed by me and considered in my medical decision making (see chart for  details).     59 year old female presents with thumb laceration.  Repaired as above.  Sutures out in 7 days.  Bactroban ointment as prescribed.  Final Clinical Impressions(s) / UC Diagnoses   Final diagnoses:  Laceration of left thumb without foreign body without damage to nail, initial encounter     Discharge Instructions     Sutures out in 7 days.  Take care  Dr. Adriana Simas    ED Prescriptions    Medication Sig Dispense Auth. Provider   mupirocin ointment (BACTROBAN) 2 % Apply 1 application topically 2 (two) times daily for 7 days. 22 g Tommie Sams, DO     PDMP not reviewed this encounter.   Tommie Sams, [place] 04/02/20 Ernestina [place]

## 2020-06-14 ENCOUNTER — Other Ambulatory Visit: Payer: Self-pay

## 2020-06-14 ENCOUNTER — Ambulatory Visit
Admission: EM | Admit: 2020-06-14 | Discharge: 2020-06-14 | Disposition: A | Discharge status: Home / Self Care | Payer: Managed Care, Other (non HMO) | Attending: Physician Assistant | Admitting: Physician Assistant

## 2020-06-14 ENCOUNTER — Encounter: Payer: Self-pay | Admitting: Emergency Medicine

## 2020-06-14 ENCOUNTER — Ambulatory Visit
Admission: EM | Admit: 2020-06-14 | Discharge: 2020-06-14 | Discharge status: Absent without leave | Payer: Managed Care, Other (non HMO)

## 2020-06-14 DIAGNOSIS — W540XXA Bitten by dog, initial encounter: Secondary | ICD-10-CM | POA: Diagnosis not present

## 2020-06-14 DIAGNOSIS — L03115 Cellulitis of right lower limb: Secondary | ICD-10-CM | POA: Diagnosis not present

## 2020-06-14 DIAGNOSIS — Z23 Encounter for immunization: Secondary | ICD-10-CM | POA: Diagnosis not present

## 2020-06-14 MED ORDER — AMOXICILLIN-POT CLAVULANATE 875-125 MG PO TABS: 1.0000 | ORAL_TABLET | Freq: Two times a day (BID) | ORAL | 0 refills | Status: AC

## 2020-06-14 MED ORDER — TETANUS-DIPHTH-ACELL PERTUSSIS 5-2.5-18.5 LF-MCG/0.5 IM SUSY
0.5000 mL | PREFILLED_SYRINGE | Freq: Once | INTRAMUSCULAR | Status: AC
  Administered 2020-06-14: 0.5 mL via INTRAMUSCULAR

## 2020-06-14 NOTE — ED Provider Notes (Signed)
[facility]    CSN: [id number] Arrival date & time: 06/14/20  0818      History   Chief Complaint Chief Complaint  Patient presents with  . Animal Bite    DOI 06/07/20    HPI Summer Ware is a 59 y.o. female presenting for concerns about a possible infected wound of the right lower leg.  Patient states that a week ago she was bitten by her dog.  She says that she was trying to break up a fight between her dog and potbelly.  When she got bit on the leg.  She says that she has been cleaning the area with peroxide and Betadine.  She is also been applying Bactroban to the area.  Patient states that she has noticed some increased redness and pain over the last few days.  Patient says that her dog is up-to-date on rabies and she has proof of that.  She says her last tetanus was in 2012.  Patient denies any fever or fatigue.  She states that she feels well otherwise.  She has no history of MRSA.  No other concerns.  HPI  Past Medical History:  Diagnosis Date  . Depression   . GERD (gastroesophageal reflux disease)   . Hypercholesteremia   . Hypothyroidism     There are no problems to display for this patient.   Past Surgical History:  Procedure Laterality Date  . ABDOMINOPLASTY    . BACK SURGERY    . BREAST REDUCTION SURGERY    . GASTRIC BYPASS     2006    OB History   No obstetric history on file.      Home Medications    Prior to Admission medications   Medication Sig Start Date End Date Taking? Authorizing Provider  atorvastatin (LIPITOR) 40 MG tablet Take 40 mg by mouth daily.   Yes [provider]  calcium-vitamin D (OSCAL WITH D) 250-125 MG-UNIT per tablet Take 1 tablet by mouth daily.   Yes [provider]  esomeprazole (NEXIUM) 40 MG capsule Take 40 mg by mouth daily at 12 noon.   Yes [provider]  lamoTRIgine (LAMICTAL) 150 MG tablet Take 300 mg by mouth daily.   Yes [provider]  levothyroxine  (SYNTHROID, LEVOTHROID) 125 MCG tablet Take 125 mcg by mouth daily before breakfast.   Yes [provider]  Multiple Vitamin (MULTIVITAMIN) capsule Take 1 capsule by mouth daily.   Yes [provider]  vortioxetine HBr (TRINTELLIX) 20 MG TABS Take by mouth.   Yes [provider]  amoxicillin-clavulanate (AUGMENTIN) 875-125 MG tablet Take 1 tablet by mouth every 12 (twelve) hours for 7 days. 06/14/20 06/21/20  Shirlee Latch, PA-C  albuterol (VENTOLIN HFA) 108 (90 Base) MCG/ACT inhaler Inhale 2 puffs into the lungs every 6 (six) hours as needed for wheezing or shortness of breath. 12/20/19 04/02/20  Renford Dills, NP  traZODone (DESYREL) 50 MG tablet Take 25 mg by mouth at bedtime.  12/14/19  [provider]  venlafaxine (EFFEXOR) 100 MG tablet Take 187.5 mg by mouth 1 day or 1 dose.  12/14/19  [provider]    Family History Family History  Problem Relation Age of Onset  . Cancer Mother   . Depression Mother   . Cancer Father   . Depression Father   . ADD / ADHD Father     Social History Social History   Tobacco Use  . Smoking status: Former Smoker  Quit date: 06/15/1995    Years since quitting: 25.0  . Smokeless tobacco: Never Used  Vaping Use  . Vaping Use: Never used  Substance Use Topics  . Alcohol use: Yes    Comment: socially  . Drug use: No     Allergies   Codeine, Morphine and related, and Ciprofloxacin   Review of Systems Review of Systems  Constitutional: Negative for fatigue and fever.  Musculoskeletal: Negative for gait problem and joint swelling.  Skin: Positive for color change and wound.  Neurological: Negative for weakness and numbness.  Hematological: Negative for adenopathy.     Physical Exam Triage Vital Signs ED Triage Vitals  Enc Vitals Group     BP 06/14/20 0834 (!) 124/94     Pulse Rate 06/14/20 0834 83     Resp 06/14/20 0834 18     Temp 06/14/20 0834 98.3 F (36.8 C)     Temp Source 06/14/20  0834 Oral     SpO2 06/14/20 0834 99 %     Weight 06/14/20 0835 128 lb (58.1 kg)     Height 06/14/20 0835 5\' 6"  (1.676 m)     Head Circumference --      Peak Flow --      Pain Score 06/14/20 0834 4     Pain Loc --      Pain Edu? --      Excl. in GC? --    No data found.  Updated Vital Signs BP (!) 124/94 (BP Location: Left Arm)   Pulse 83   Temp 98.3 F (36.8 C) (Oral)   Resp 18   Ht 5\' 6"  (1.676 m)   Wt 128 lb 1.4 oz (58.1 kg)   SpO2 99%   BMI 20.67 kg/m       Physical Exam Vitals and nursing note reviewed.  Constitutional:      General: She is not in acute distress.    Appearance: Normal appearance. She is not ill-appearing or toxic-appearing.  HENT:     Head: Normocephalic and atraumatic.  Eyes:     General: No scleral icterus.       Right eye: No discharge.        Left eye: No discharge.     Conjunctiva/sclera: Conjunctivae normal.  Cardiovascular:     Rate and Rhythm: Normal rate and regular rhythm.     Pulses: Normal pulses.  Pulmonary:     Effort: Pulmonary effort is normal. No respiratory distress.     Breath sounds: Normal breath sounds.  Musculoskeletal:     Cervical back: Neck supple.  Skin:    General: Skin is dry.     Comments: See images below.  There are multiple scabbed areas of the right lower leg.  There is mild surrounding erythema/swelling and tenderness.  No drainage.  Normal sensation.  Full range of motion of ankle.  Neurological:     General: No focal deficit present.     Mental Status: She is alert. Mental status is at baseline.     Motor: No weakness.     Gait: Gait normal.  Psychiatric:        Mood and Affect: Mood normal.        Behavior: Behavior normal.        Thought Content: Thought content normal.         UC Treatments / Results  Labs (all labs ordered are listed, but only abnormal results are displayed) Labs Reviewed - No data to display  EKG   Radiology No results found.  Procedures Procedures (including  critical care time)  Medications Ordered in UC Medications  Tdap (BOOSTRIX) injection 0.5 mL (0.5 mLs Intramuscular Given 06/14/20 0844)    Initial Impression / Assessment and Plan / UC Course  I have reviewed the triage vital signs and the nursing notes.  Pertinent labs & imaging results that were available during my care of the patient were reviewed by me and considered in my medical decision making (see chart for details).   59 year old female presenting for concerns about infected dog bite by her dog.  She says that it is got increasingly more red and painful over the past week despite cleaning it regularly.  On exam, there appears to be a minor cellulitis that is starting.  There also appears to be healing skin around the wounds and scabbing.  The area of redness is tender and patient states that it is more tender than it had been.  Dog bite has been reported to animal control.  Advised patient she may be contacted for more information.  Tetanus updated today.  Treating minor cellulitis with Augmentin.  Advised to clean with soap and water and she can apply mupirocin sure he has.  Advised against continuing to use hydrogen peroxide to clean wound.  Advised to follow-up if any worsening symptoms.  Advised that the area should be getting better and not worse.  ED precautions discussed.   Final Clinical Impressions(s) / UC Diagnoses   Final diagnoses:  Cellulitis of leg, right  Dog bite, initial encounter     Discharge Instructions     The bite wound looks mildly infected.  Start antibiotics as prescribed and complete full course.  You can continue to clean the area with soap and water apply mupirocin.  Look out for any spreading redness or increased pain.  If that happens you should be seen again.  Your tetanus has been updated today.    ED Prescriptions    Medication Sig Dispense Auth. Provider   amoxicillin-clavulanate (AUGMENTIN) 875-125 MG tablet Take 1 tablet by mouth every  12 (twelve) hours for 7 days. 14 tablet Gareth Morgan     PDMP not reviewed this encounter.   Shirlee Latch, PA-C 06/14/20 [phone]

## 2020-06-14 NOTE — ED Triage Notes (Signed)
Patient in today c/o dog bite to right lower leg x 1 week. Patient states it was her dog and she brought proof that the dog is UTD on rabies. Patient states she cleaned with peroxide and betadine and has been applying Bactroban to the area. Patient concerned that area may be infected. Patient's last Tdap was 04/14/2011.

## 2020-06-14 NOTE — Discharge Instructions (Signed)
The bite wound looks mildly infected.  Start antibiotics as prescribed and complete full course.  You can continue to clean the area with soap and water apply mupirocin.  Look out for any spreading redness or increased pain.  If that happens you should be seen again.  Your tetanus has been updated today.

## 2020-06-25 DIAGNOSIS — M47812 Spondylosis without myelopathy or radiculopathy, cervical region: Secondary | ICD-10-CM | POA: Insufficient documentation

## 2020-08-17 ENCOUNTER — Ambulatory Visit
Admission: RE | Admit: 2020-08-17 | Discharge: 2020-08-17 | Disposition: A | Discharge status: Home / Self Care | Payer: Managed Care, Other (non HMO) | Source: Ambulatory Visit | Attending: Sports Medicine | Admitting: Sports Medicine

## 2020-08-17 ENCOUNTER — Other Ambulatory Visit: Payer: Self-pay

## 2020-08-17 VITALS — BP 98/69 | HR 102 | Temp 97.5°F | Resp 14 | Ht 66.0 in | Wt 133.0 lb

## 2020-08-17 DIAGNOSIS — R5383 Other fatigue: Secondary | ICD-10-CM | POA: Insufficient documentation

## 2020-08-17 DIAGNOSIS — R6883 Chills (without fever): Secondary | ICD-10-CM | POA: Diagnosis not present

## 2020-08-17 DIAGNOSIS — R197 Diarrhea, unspecified: Secondary | ICD-10-CM | POA: Diagnosis not present

## 2020-08-17 DIAGNOSIS — M791 Myalgia, unspecified site: Secondary | ICD-10-CM | POA: Diagnosis not present

## 2020-08-17 DIAGNOSIS — R519 Headache, unspecified: Secondary | ICD-10-CM | POA: Diagnosis not present

## 2020-08-17 DIAGNOSIS — M549 Dorsalgia, unspecified: Secondary | ICD-10-CM | POA: Diagnosis not present

## 2020-08-17 DIAGNOSIS — Z20822 Contact with and (suspected) exposure to covid-19: Secondary | ICD-10-CM | POA: Diagnosis not present

## 2020-08-17 DIAGNOSIS — Z87891 Personal history of nicotine dependence: Secondary | ICD-10-CM | POA: Insufficient documentation

## 2020-08-17 DIAGNOSIS — R6889 Other general symptoms and signs: Secondary | ICD-10-CM | POA: Diagnosis not present

## 2020-08-17 DIAGNOSIS — R059 Cough, unspecified: Secondary | ICD-10-CM | POA: Insufficient documentation

## 2020-08-17 NOTE — Discharge Instructions (Signed)
Your Covid test was pending.  Please isolate per the instructions provided.  If your test is positive someone will contact you and you will switch over to quarantine per the current CDC guidelines. Just supportive care, plenty of rest, plenty fluids, over-the-counter meds as needed, Tylenol or Motrin for fever or discomfort. If you develop any significant shortness of breath or chest pain you need to call 911 or go to the nearest emergency department.  I hope you get to feeling better, Dr. Zachery Dauer

## 2020-08-17 NOTE — ED Provider Notes (Signed)
[facility]    CSN: [id number] Arrival date & time: 08/17/20  0948      History   Chief Complaint Chief Complaint  Patient presents with  . Cough  . Back Pain  . Fatigue    HPI Summer Ware is a 60 y.o. female.   Pleasant 60 year old female who presents for evaluation of some Covid-like symptoms.  She reports her symptoms began yesterday with chills and headache and which she calls a low-grade fever.  T-max was 99.8.  Is progressed now with associated body aches dry cough fatigue.  This morning she woke up with a little bit of back discomfort.  She does have a history of chronic back issues and has had 2 surgeries.  She has been vaccinated against COVID and also has had the booster.  She is also had a flu shot.  She reports a little bit of loose stools but no nausea or vomiting.  No chest pain shortness of breath sore throat or ear pain.  No abdominal pain urinary symptoms blood in the urine or stool.  No red flag signs or symptoms are elicited on history.  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  She does not need a work note and feels as though she can work from home until she feels better.     Past Medical History:  Diagnosis Date  . Depression   . GERD (gastroesophageal reflux disease)   . Hypercholesteremia   . Hypothyroidism     There are no problems to display for this patient.   Past Surgical History:  Procedure Laterality Date  . ABDOMINOPLASTY    . BACK SURGERY    . BREAST REDUCTION SURGERY    . GASTRIC BYPASS     2006    OB History   No obstetric history on file.      Home Medications    Prior to Admission medications   Medication Sig Start Date End Date Taking? Authorizing Provider  atorvastatin (LIPITOR) 40 MG tablet Take 40 mg by mouth daily.   Yes [provider]  calcium-vitamin D (OSCAL WITH D) 250-125 MG-UNIT per tablet Take 1 tablet by mouth daily.   Yes [provider]   dextroamphetamine (DEXTROSTAT) 10 MG tablet Take 10 mg by mouth every morning. 08/09/20  Yes [provider]  DULoxetine (CYMBALTA) 20 MG capsule Take by mouth. 08/09/20  Yes [provider]  esomeprazole (NEXIUM) 40 MG capsule Take 40 mg by mouth daily at 12 noon.   Yes [provider]  ferrous fumarate-b12-vitamic C-folic acid (TRINSICON / FOLTRIN) capsule Take by mouth. 12/09/06  Yes [provider]  lamoTRIgine (LAMICTAL) 150 MG tablet Take 300 mg by mouth daily.   Yes [provider]  levothyroxine (SYNTHROID, LEVOTHROID) 125 MCG tablet Take 125 mcg by mouth daily before breakfast.   Yes [provider]  Multiple Vitamin (MULTIVITAMIN) capsule Take 1 capsule by mouth daily.   Yes [provider]  traZODone (DESYREL) 50 MG tablet TAKE 3 TABLET AT BEDTIME PLUS 1 TAB AS NEEDED FOR INSOMNIA (MAX 200MG /24HRS) 07/22/20  Yes [provider]  vortioxetine HBr (TRINTELLIX) 20 MG TABS tablet Take by mouth.   Yes [provider]  albuterol (VENTOLIN HFA) 108 (90 Base) MCG/ACT inhaler Inhale 2 puffs into the lungs every 6 (six) hours as needed for wheezing or shortness of breath. 12/20/19 04/02/20  04/04/20, NP  venlafaxine (EFFEXOR) 100 MG tablet Take 187.5 mg  by mouth 1 day or 1 dose.  12/14/19  [provider]    Family History Family History  Problem Relation Age of Onset  . Cancer Mother   . Depression Mother   . Cancer Father   . Depression Father   . ADD / ADHD Father     Social History Social History   Tobacco Use  . Smoking status: Former Smoker    Quit date: 06/15/1995    Years since quitting: 25.1  . Smokeless tobacco: Never Used  Vaping Use  . Vaping Use: Never used  Substance Use Topics  . Alcohol use: Yes    Comment: socially  . Drug use: No     Allergies   Codeine, Morphine and related, and Ciprofloxacin   Review of Systems Review of Systems  Constitutional: Positive for  chills, fatigue and fever.  HENT: Positive for congestion and sneezing. Negative for ear discharge, ear pain, sinus pressure, sinus pain and sore throat.   Eyes: Negative for pain.  Respiratory: Positive for cough. Negative for shortness of breath, wheezing and stridor.   Cardiovascular: Negative for chest pain and palpitations.  Gastrointestinal: Negative for abdominal pain.  Genitourinary: Negative for dysuria and hematuria.  Musculoskeletal: Positive for back pain and myalgias. Negative for neck pain and neck stiffness.  Skin: Negative for color change, pallor, rash and wound.  Neurological: Positive for headaches. Negative for dizziness, weakness and light-headedness.  All other systems reviewed and are negative.    Physical Exam Triage Vital Signs ED Triage Vitals  Enc Vitals Group     BP 08/17/20 1001 98/69     Pulse Rate 08/17/20 1001 (!) 102     Resp 08/17/20 1001 14     Temp 08/17/20 1001 (!) 97.5 F (36.4 C)     Temp Source 08/17/20 1001 Oral     SpO2 08/17/20 1001 96 %     Weight 08/17/20 0956 133 lb (60.3 kg)     Height 08/17/20 0956 5\' 6"  (1.676 m)     Head Circumference --      Peak Flow --      Pain Score 08/17/20 0955 8     Pain Loc --      Pain Edu? --      Excl. in GC? --    No data found.  Updated Vital Signs BP 98/69 (BP Location: Left Arm)   Pulse (!) 102   Temp (!) 97.5 F (36.4 C) (Oral)   Resp 14   Ht 5\' 6"  (1.676 m)   Wt 60.3 kg   SpO2 96%   BMI 21.47 kg/m   Visual Acuity Right Eye Distance:   Left Eye Distance:   Bilateral Distance:    Right Eye Near:   Left Eye Near:    Bilateral Near:     Physical Exam Vitals and nursing note reviewed.  Constitutional:      General: She is not in acute distress.    Appearance: Normal appearance. She is not ill-appearing, toxic-appearing or diaphoretic.  HENT:     Head: Normocephalic and atraumatic.     Right Ear: Tympanic membrane normal.     Left Ear: Tympanic membrane normal.     Nose:  Nose normal. No congestion or rhinorrhea.     Mouth/Throat:     Mouth: Mucous membranes are moist.     Pharynx: Oropharynx is clear. No oropharyngeal exudate.  Eyes:     Extraocular Movements: Extraocular movements intact.     Conjunctiva/sclera:  Conjunctivae normal.     Pupils: Pupils are equal, round, and reactive to light.  Cardiovascular:     Rate and Rhythm: Normal rate and regular rhythm.     Pulses: Normal pulses.     Heart sounds: Normal heart sounds. No murmur heard. No friction rub. No gallop.      Comments: Recheck of her heart rate showed a rate of 88 bpm. Pulmonary:     Effort: Pulmonary effort is normal. No respiratory distress.     Breath sounds: Normal breath sounds. No stridor. No wheezing, rhonchi or rales.  Musculoskeletal:     Cervical back: Normal range of motion and neck supple. No rigidity or tenderness.  Skin:    General: Skin is warm and dry.     Capillary Refill: Capillary refill takes less than 2 seconds.  Neurological:     General: No focal deficit present.     Mental Status: She is alert and oriented to person, place, and time.     Sensory: Sensation is intact.     Motor: Motor function is intact.     Coordination: Coordination is intact.      UC Treatments / Results  Labs (all labs ordered are listed, but only abnormal results are displayed) Labs Reviewed  SARS CORONAVIRUS 2 (TAT 6-24 HRS)    EKG   Radiology No results found.  Procedures Procedures (including critical care time)  Medications Ordered in UC Medications - No data to display  Initial Impression / Assessment and Plan / UC Course  I have reviewed the triage vital signs and the nursing notes.  Pertinent labs & imaging results that were available during my care of the patient were reviewed by me and considered in my medical decision making (see chart for details).   Clinical impression: 2 days of COVID-like symptoms with chills headaches low-grade fever body aches dry  cough and fatigue with some back discomfort.  Patient is fully vaccinated.  No Covid exposure that she knows of.  Exam and vital signs are reassuring.  Treatment plan: 1.  The findings and treatment plan were discussed in detail with the patient.  Patient was in agreement. 2.  Recommend getting Covid test.  Advised the patient that I will take 6 to 24 hours to come back.  She should follow along in my chart.  In the interim she should isolate pending the results.  If they are positive then someone will call her and give her a work note as well as change her isolation to quarantine.  If it is negative then she can return to work next week.  I will give her a work note. 3.  Supportive care, over-the-counter meds as needed, Tylenol or Motrin for fever discomfort.  Plenty of rest plenty of fluids. 4.  Discussed the red flag signs and symptoms and when to seek out immediate medical attention.  She voiced verbal understanding. 5.  I offered her a work note at the present time but she declined. 6.  Discharge from care and follow-up as needed.    Final Clinical Impressions(s) / UC Diagnoses   Final diagnoses:  Flu-like symptoms  Myalgia  Acute nonintractable headache, unspecified headache type  Fatigue, unspecified type  Chills     Discharge Instructions     Your Covid test was pending.  Please isolate per the instructions provided.  If your test is positive someone will contact you and you will switch over to quarantine per the current CDC guidelines. Just supportive  care, plenty of rest, plenty fluids, over-the-counter meds as needed, Tylenol or Motrin for fever or discomfort. If you develop any significant shortness of breath or chest pain you need to call 911 or go to the nearest emergency department.  I hope you get to feeling better, Dr. Zachery Dauer    ED Prescriptions    None     PDMP not reviewed this encounter.   Delton See, MD 08/17/20 1050

## 2020-08-17 NOTE — ED Triage Notes (Signed)
Patient c/o cough, congestion, headache, bodyaches and low grade fever that started yesterday.

## 2020-08-18 LAB — SARS CORONAVIRUS 2 (TAT 6-24 HRS): SARS Coronavirus 2: NEGATIVE

## 2020-10-08 DIAGNOSIS — M542 Cervicalgia: Secondary | ICD-10-CM | POA: Insufficient documentation

## 2021-06-24 IMAGING — CR DG CHEST 2V
2 series · 2 of 2 positions shown · non-contrast
Comparison: None.

CLINICAL DATA: Cough for a few weeks.

EXAM:
CHEST - 2 VIEW

[chest pa]
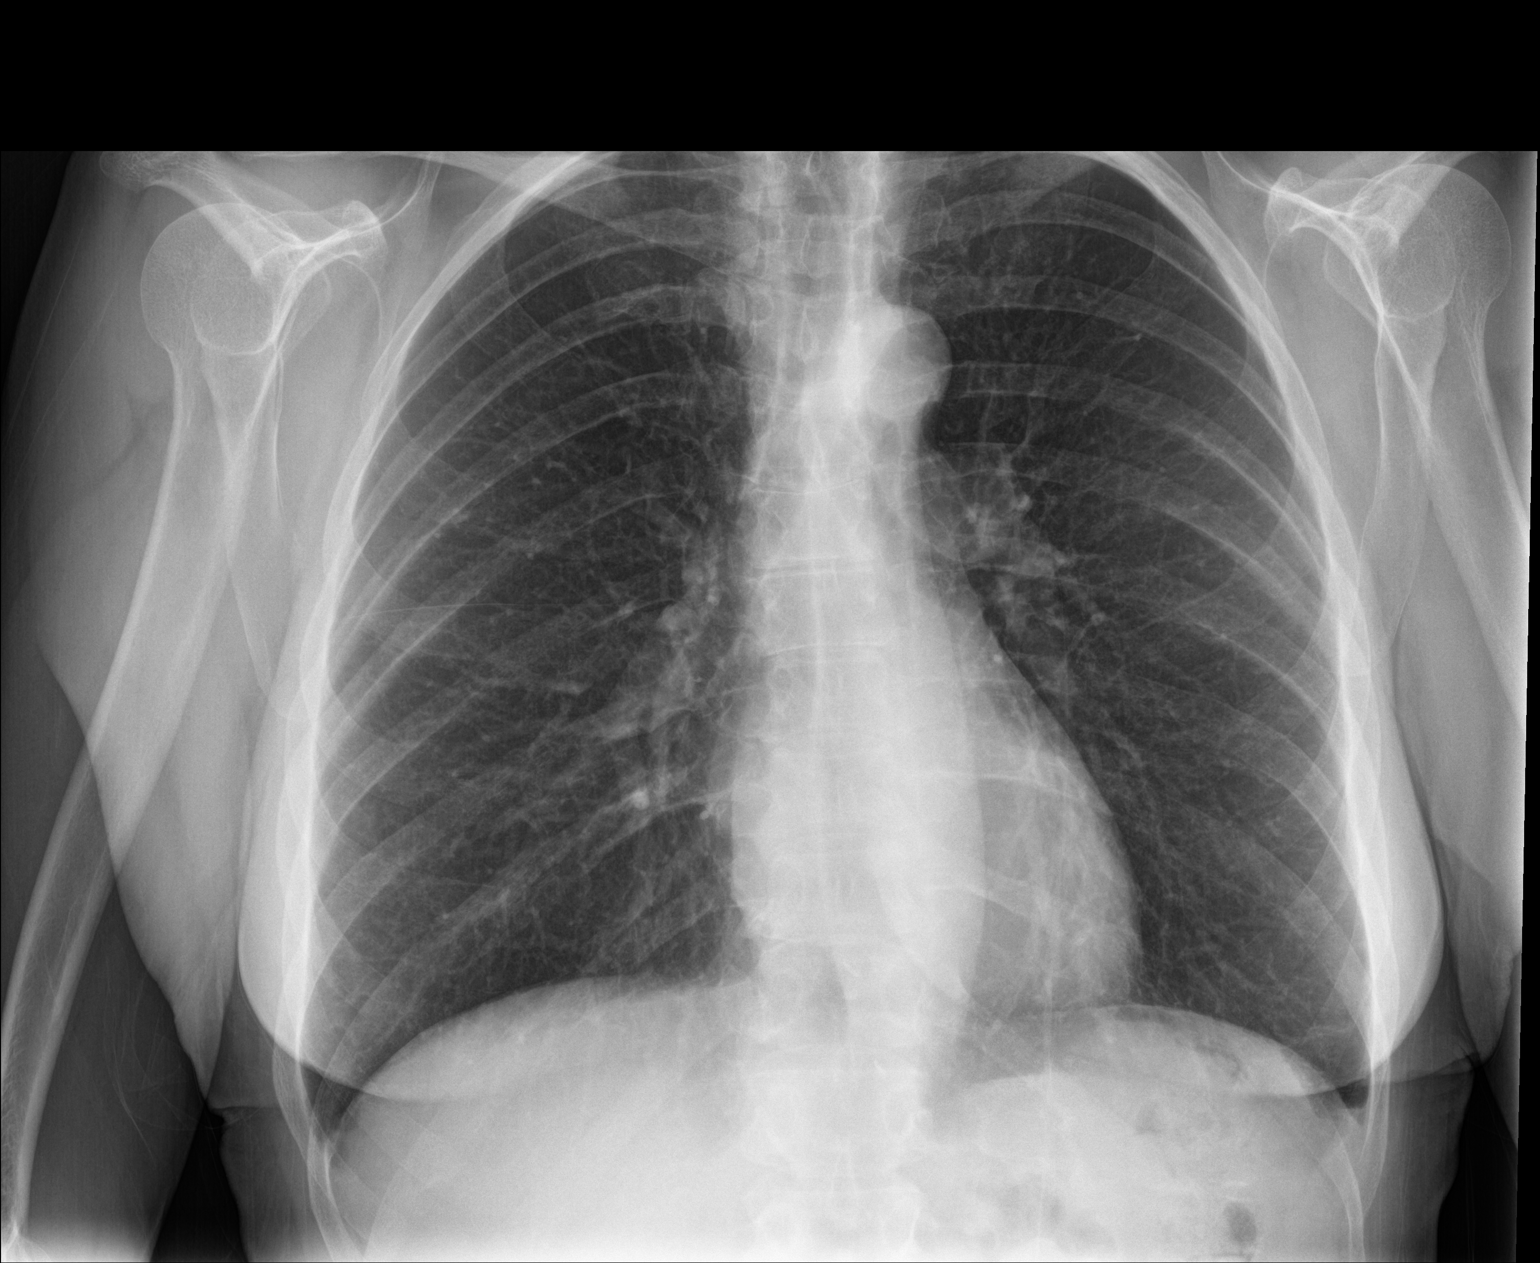

[chest lat]
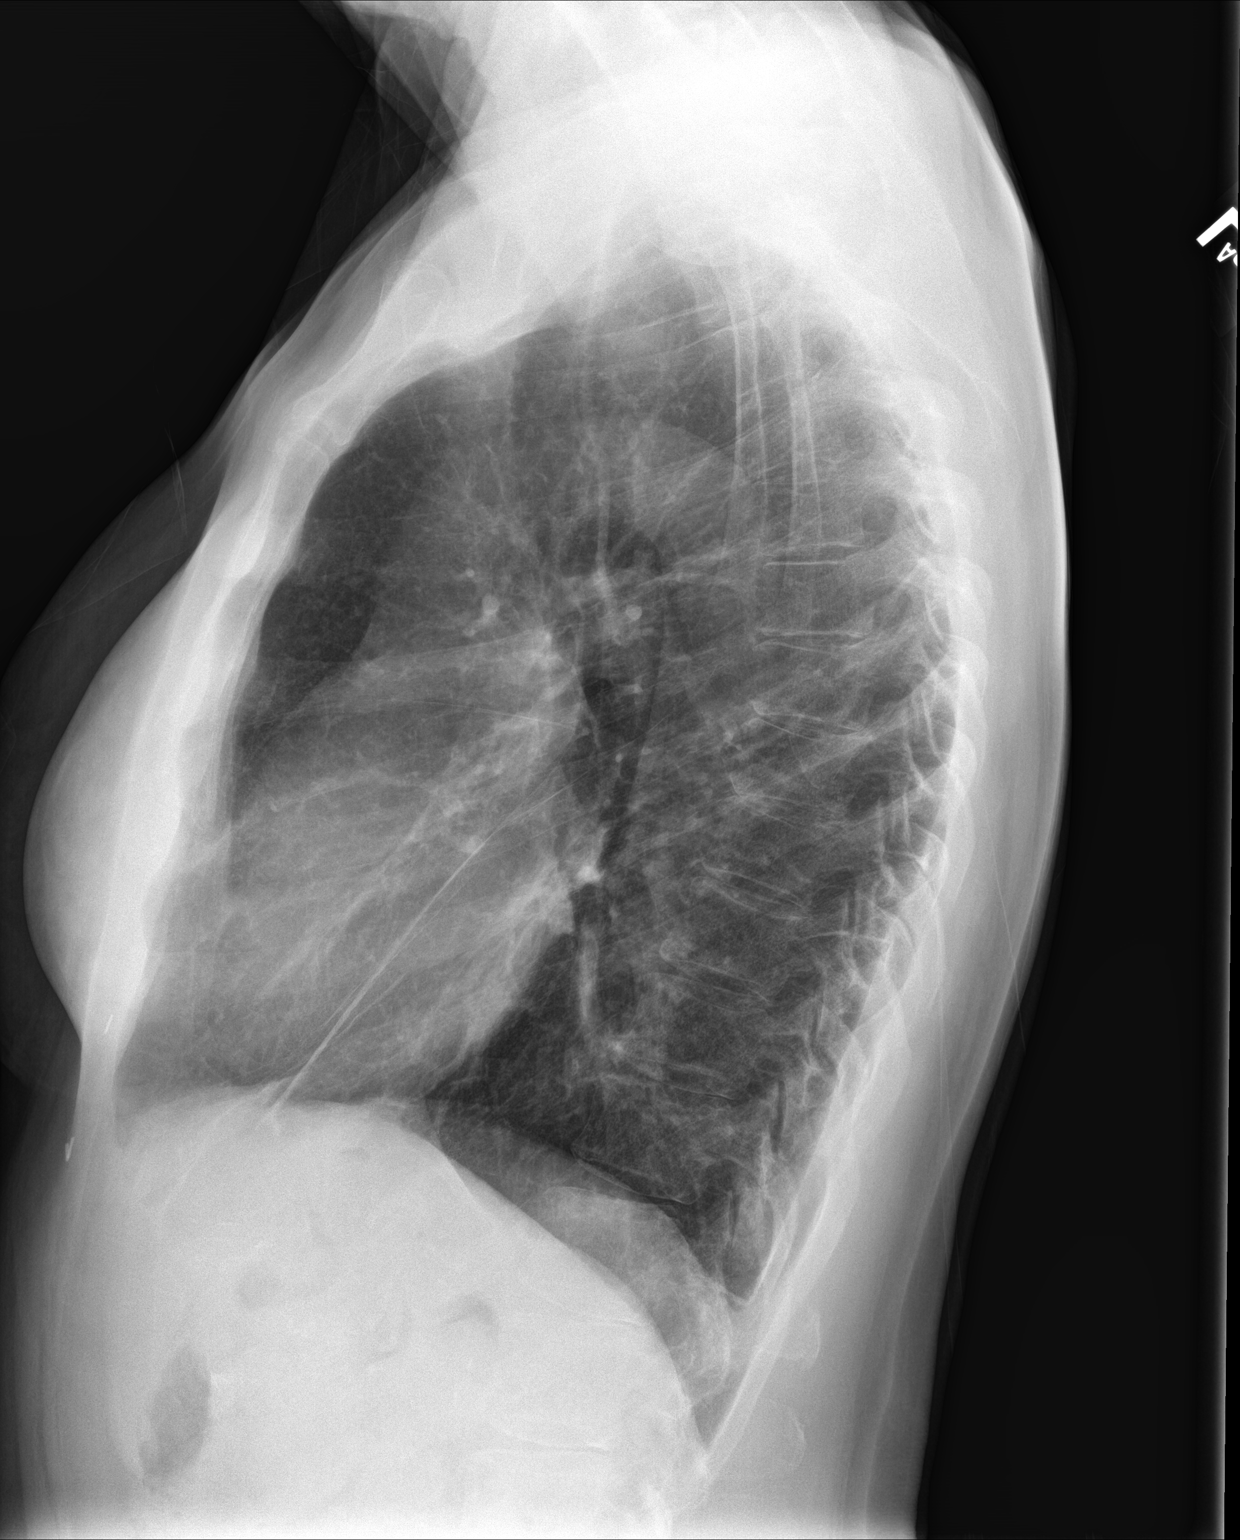

[2 of 2 positions shown; findings below may reference images not displayed]

FINDINGS: Lungs clear. Heart size normal. No pneumothorax or pleural fluid. No
acute or focal bony abnormality.
IMPRESSION: Negative chest.

## 2021-10-28 ENCOUNTER — Ambulatory Visit
Admission: EM | Admit: 2021-10-28 | Discharge: 2021-10-28 | Disposition: A | Discharge status: Home / Self Care | Payer: BLUE CROSS/BLUE SHIELD | Attending: Physician Assistant | Admitting: Physician Assistant

## 2021-10-28 DIAGNOSIS — M461 Sacroiliitis, not elsewhere classified: Secondary | ICD-10-CM | POA: Insufficient documentation

## 2021-10-28 DIAGNOSIS — M778 Other enthesopathies, not elsewhere classified: Secondary | ICD-10-CM | POA: Insufficient documentation

## 2021-10-28 DIAGNOSIS — M25529 Pain in unspecified elbow: Secondary | ICD-10-CM | POA: Insufficient documentation

## 2021-10-28 DIAGNOSIS — M7710 Lateral epicondylitis, unspecified elbow: Secondary | ICD-10-CM | POA: Insufficient documentation

## 2021-10-28 DIAGNOSIS — M545 Low back pain, unspecified: Secondary | ICD-10-CM | POA: Diagnosis not present

## 2021-10-28 DIAGNOSIS — M1610 Unilateral primary osteoarthritis, unspecified hip: Secondary | ICD-10-CM | POA: Insufficient documentation

## 2021-10-28 DIAGNOSIS — M25539 Pain in unspecified wrist: Secondary | ICD-10-CM | POA: Insufficient documentation

## 2021-10-28 LAB — URINALYSIS, ROUTINE W REFLEX MICROSCOPIC
Bilirubin Urine: NEGATIVE
Glucose, UA: NEGATIVE mg/dL
Hgb urine dipstick: NEGATIVE
Leukocytes,Ua: NEGATIVE
Nitrite: NEGATIVE
Protein, ur: NEGATIVE mg/dL
Specific Gravity, Urine: 1.015 (ref 1.005–1.030)
pH: 5.5 (ref 5.0–8.0)

## 2021-10-28 MED ORDER — LIDOCAINE 5 % EX PTCH
1.0000 | MEDICATED_PATCH | CUTANEOUS | 0 refills | Status: DC
Start: 2021-10-28 — End: 2023-10-12

## 2021-10-28 MED ORDER — METHOCARBAMOL 500 MG PO TABS: 500.0000 mg | ORAL_TABLET | Freq: Two times a day (BID) | ORAL | 0 refills | Status: DC | PRN

## 2021-10-28 MED ORDER — HYDROCODONE-ACETAMINOPHEN 5-325 MG PO TABS: 1.0000 | ORAL_TABLET | Freq: Every evening | ORAL | 0 refills | Status: AC | PRN

## 2021-10-28 NOTE — ED Triage Notes (Signed)
Patient is here for back pain. Recurrent and worsening since Saturday. Lower right part of back. Taking Motrin 800mg  PO, not helping. No tingling. No radiation of pain down leg/buttocks.  ?

## 2021-10-28 NOTE — Discharge Instructions (Signed)
Your urine was normal.  I believe that you strained a muscle.  Please continue over-the-counter ibuprofen for pain relief.  I have called in 3 doses of hydrocodone to be taken at night.  This will make you very sleepy so do not drive drink alcohol with taking it.  You should not take your clonazepam (Klonopin) at the same time as the hydrocodone as it increases your risk of side effects including falls.  Use lidocaine patches as prescribed.  Use heat, rest, stretch for symptom relief.  You can take methocarbamol twice a day.  This to make you sleepy so do not drive or drink alcohol with taking it.  Please follow-up with orthopedics if symptoms or not improving quickly.  If anything worsens and you have bowel/motor incontinence you need to be seen immediately. ?

## 2021-10-28 NOTE — ED Provider Notes (Signed)
?[facility] ? ? ? ?CSN: [id number] ?Arrival date & time: 10/28/21  1742 ? ? ?  ? ?History   ?Chief Complaint ?Chief Complaint  ?Patient presents with  ? Back Pain  ? ? ?HPI ?Summer Ware is a 61 y.o. female.  ? ?Patient presents today with a 3-day history of severe right-sided lower back pain.  Reports that symptoms began as she was making the bed and has had worsening symptoms since that time.  Pain is rated 10+ on a 0-10 pain scale, localized to right lumbar back, described as intense aching/spasm, no alleviating factors identified.  She denies any bowel/bladder incontinence, lower extremity weakness, saddle anesthesia.  She does have a history of recurrent back pain and has had 2 spinal surgeries in the past.  She has been taking ibuprofen as well as all prescription for narcotic pain medication feeling temporary relief of symptoms.  She denies any fever, abdominal pain, nausea, vomiting, weakness. ? ? ?Past Medical History:  ?Diagnosis Date  ? Depression   ? GERD (gastroesophageal reflux disease)   ? Hypercholesteremia   ? Hypothyroidism   ? ? ?Patient Active Problem List  ? Diagnosis Date Noted  ? Enthesopathy of elbow region 10/28/2021  ? Inflammation of sacroiliac joint (HCC) 10/28/2021  ? Lateral epicondylitis 10/28/2021  ? Pain in elbow 10/28/2021  ? Primary localized osteoarthritis of pelvic region and thigh 10/28/2021  ? Wrist joint pain 10/28/2021  ? Neck pain 10/08/2020  ? Cervical spondylosis 06/25/2020  ? Mild neurocognitive disorder 08/01/2014  ? Abnormal MRI 04/20/2014  ? Bipolar 1 disorder (HCC) 04/20/2014  ? Depression 04/20/2014  ? Insomnia 04/20/2014  ? ? ?Past Surgical History:  ?Procedure Laterality Date  ? ABDOMINOPLASTY    ? BACK SURGERY    ? BREAST REDUCTION SURGERY    ? GASTRIC BYPASS    ? 2006  ? ? ?OB History   ?No obstetric history on file. ?  ? ? ? ?Home Medications   ? ?Prior to Admission medications   ?Medication Sig Start Date End Date Taking? Authorizing  Provider  ?lidocaine (LIDODERM) 5 % Place 1 patch onto the skin daily. Remove & Discard patch within 12 hours or as directed by MD 10/28/21  Yes Benn Tarver, Noberto Retort, PA-C  ?acyclovir ointment (ZOVIRAX) 5 %  11/07/09   [provider]  ?atorvastatin (LIPITOR) 10 MG tablet  10/26/09   [provider]  ?atorvastatin (LIPITOR) 40 MG tablet Take 40 mg by mouth daily.    [provider]  ?atorvastatin (LIPITOR) 80 MG tablet atorvastatin 80 mg tablet ? TAKE 1 TABLET BY MOUTH DAILYAT BEDTIME    [provider]  ?benzonatate (TESSALON) 100 MG capsule benzonatate 100 mg capsule    [provider]  ?bimatoprost (LATISSE) 0.03 % ophthalmic solution bimatoprost 0.03 % drops with applicator, eyelash base ? APPLY TO UPPER LASHLINE AT BEDTIME AS DIRECTED.    [provider]  ?brexpiprazole (REXULTI) 1 MG TABS tablet Rexulti 1 mg tablet ? TAKE 1 TABLET BY MOUTH EVERY DAY    [provider]  ?calcium-vitamin D (OSCAL WITH D) 250-125 MG-UNIT per tablet Take 1 tablet by mouth daily.    [provider]  ?CAPLYTA 42 MG capsule Take 42 mg by mouth every morning. 09/02/21   [provider]  ?chlorpheniramine-HYDROcodone (TUSSIONEX) 10-8 MG/5ML SUER hydrocodone 10 mg-chlorpheniramine 8 mg/5 mL oral susp extend.rel 12hr    [provider]  ?dextroamphetamine (DEXTROSTAT) 10 MG tablet dextroamphetamine 10 mg tablet  [provider]  ?DULoxetine (CYMBALTA) 20 MG capsule duloxetine 20 mg capsule,delayed release    [provider]  ?DULoxetine (CYMBALTA) 30 MG capsule duloxetine 30 mg capsule,delayed release ? TAKE 3 CAPSULES BY MOUTH ONCE A DAY    [provider]  ?DULoxetine (CYMBALTA) 60 MG capsule duloxetine 60 mg capsule,delayed release    [provider]  ?esomeprazole (NEXIUM) 40 MG capsule Take by mouth.    [provider]  ?ferrous fumarate-b12-vitamic C-folic acid (TRINSICON / FOLTRIN) capsule Take by mouth.  12/09/06   [provider]  ?gabapentin (NEURONTIN) 100 MG capsule  10/06/16   [provider]  ?halobetasol (ULTRAVATE) 0.05 % cream halobetasol propionate 0.05 % topical cream    [provider]  ?HYDROcodone-acetaminophen (NORCO/VICODIN) 5-325 MG tablet Take 1 tablet by mouth at bedtime as needed for up to 3 days for moderate pain. 10/28/21 10/31/21  Tylin Stradley, Noberto RetortErin K, PA-C  ?lamoTRIgine (LAMICTAL) 100 MG tablet lamotrigine 100 mg tablet    [provider]  ?lamoTRIgine (LAMICTAL) 150 MG tablet Take 300 mg by mouth daily.    [provider]  ?LamoTRIgine 300 MG TB24 24 hour tablet Take by mouth. 09/05/11   [provider]  ?levothyroxine (SYNTHROID) 100 MCG tablet Synthroid 100 mcg tablet 09/08/16   [provider]  ?levothyroxine (SYNTHROID) 112 MCG tablet Synthroid 112 mcg tablet ? TAKE 1 TABLET BY MOUTH EVERY DAY FOR 90 DAYS    [provider]  ?levothyroxine (SYNTHROID) 137 MCG tablet  10/08/09   [provider]  ?levothyroxine (SYNTHROID, LEVOTHROID) 125 MCG tablet Take 125 mcg by mouth daily before breakfast.    [provider]  ?methocarbamol (ROBAXIN) 500 MG tablet Take 1 tablet (500 mg total) by mouth 2 (two) times daily as needed for muscle spasms. 10/28/21   Dequandre Cordova, Noberto RetortErin K, PA-C  ?mirtazapine (REMERON) 7.5 MG tablet mirtazapine 7.5 mg tablet    [provider]  ?Multiple Vitamin (ONE DAILY) tablet Take 1 tablet by mouth daily.    [provider]  ?naproxen (NAPROSYN) 500 MG tablet naproxen 500 mg tablet ? 1 TAB BY MOUTH 2 TIMES PER DAY AS NEEDED FOR PAIN    [provider]  ?predniSONE (DELTASONE) 10 MG tablet prednisone 10 mg tablet    [provider]  ?ranitidine (ZANTAC) 300 MG tablet  08/15/16   [provider]  ?sodium fluoride (PREVIDENT 5000 PLUS) 1.1 % CREA dental cream Denta 5000 Plus 1.1 % cream    [provider]  ?Teriparatide, Recombinant, (FORTEO) 600  MCG/2.4ML SOPN  05/27/16   [provider]  ?topiramate (TOPAMAX) 50 MG tablet topiramate 50 mg tablet ? TAKE 1 TABLET BY MOUTH DAILY FOR 1 WEEK, THEN INCREASE TO TWICE DAILY    [provider]  ?traZODone (DESYREL) 50 MG tablet TAKE 3 TABLET AT BEDTIME PLUS 1 TAB AS NEEDED FOR INSOMNIA (MAX 200MG /24HRS) 07/22/20   [provider]  ?UNABLE TO FIND methylprednisolone 4 mg tablets in a dose pack ? TAPER AS DIRECTED    [provider]  ?venlafaxine XR (EFFEXOR-XR) 150 MG 24 hr capsule Take by mouth. 01/16/14   [provider]  ?venlafaxine XR (EFFEXOR-XR) 37.5 MG 24 hr capsule  04/18/14   [provider]  ?vortioxetine HBr (TRINTELLIX) 10 MG TABS tablet Trintellix 10 mg tablet 11/11/17   [provider]  ?Prudy FeelerXANAX 0.5 MG tablet Take 0.5 mg by mouth daily as needed. 07/22/21   [provider]  ?albuterol (VENTOLIN HFA)  108 (90 Base) MCG/ACT inhaler Inhale 2 puffs into the lungs every 6 (six) hours as needed for wheezing or shortness of breath. 12/20/19 04/02/20  Renford Dills, NP  ? ? ?Family History ?Family History  ?Problem Relation Age of Onset  ? Cancer Mother   ? Depression Mother   ? Cancer Father   ? Depression Father   ? ADD / ADHD Father   ? ? ?Social History ?Social History  ? ?Tobacco Use  ? Smoking status: Former  ?  Types: Cigarettes  ?  Quit date: 06/15/1995  ?  Years since quitting: 26.3  ? Smokeless tobacco: Never  ?Vaping Use  ? Vaping Use: Never used  ?Substance Use Topics  ? Alcohol use: Yes  ?  Comment: socially  ? Drug use: No  ? ? ? ?Allergies   ?Codeine, Morphine, Morphine and related, and Other ? ? ?Review of Systems ?Review of Systems  ?Constitutional:  Positive for activity change. Negative for appetite change, fatigue and fever.  ?Respiratory:  Negative for cough and shortness of breath.   ?Cardiovascular:  Negative for chest pain.  ?Gastrointestinal:  Negative for abdominal pain, diarrhea, nausea and vomiting.  ?Musculoskeletal:   Positive for back pain. Negative for arthralgias and myalgias.  ?Neurological:  Negative for dizziness, weakness, light-headedness, numbness and headaches.  ? ? ?Physical Exam ?Triage Vital Signs ?ED Triage Vitals  ?Enc

## 2023-10-12 ENCOUNTER — Ambulatory Visit
Admission: EM | Admit: 2023-10-12 | Discharge: 2023-10-12 | Disposition: A | Discharge status: Home / Self Care | Attending: Family Medicine | Admitting: Family Medicine

## 2023-10-12 DIAGNOSIS — S61215A Laceration without foreign body of left ring finger without damage to nail, initial encounter: Secondary | ICD-10-CM | POA: Diagnosis not present

## 2023-10-12 MED ORDER — AMOXICILLIN-POT CLAVULANATE 875-125 MG PO TABS: 1.0000 | ORAL_TABLET | Freq: Two times a day (BID) | ORAL | 0 refills | Status: DC

## 2023-10-12 NOTE — ED Triage Notes (Signed)
 Pt c/o LAC of L ring finger x2 days. States was using a dermal tool & cut herself. Last tetanus 06/2020.

## 2023-10-12 NOTE — Discharge Instructions (Addendum)
 Stop by the pharmacy to pick up your prescriptions.  Follow up with your primary care provider or return to the urgent care, if not improving.

## 2023-10-12 NOTE — ED Provider Notes (Signed)
 [facility]    CSN: [id number] Arrival date & time: 10/12/23  1826      History   Chief Complaint No chief complaint on file.   HPI Summer Ware is a 63 y.o. female.   HPI  Summer Ware presents for left ring finger laceration that occurred 2 days ago.  She was trying to cut small metal pieces with a derma-saw.  The saw slipped and went across her finger.  She has been putting Neosporin on it and wrapping it up. She gets her hands wet often as she rehabs wild-life.      ***Redness, pain, swelling ***Treatments tried ***Previous symptoms ***Insect or bug bites  Fever : no Vision changes: No Sore throat: no   Shortness of breath: no Rhinorrhea: no Appetite: normal  Hydration: normal  Abdominal pain: no Nausea: no Vomiting: no Diarrhea: no Dysuria: no  Sleep disturbance: no Arthralgias: no Headache: no   Past Medical History:  Diagnosis Date   Depression    GERD (gastroesophageal reflux disease)    Hypercholesteremia    Hypothyroidism     Patient Active Problem List   Diagnosis Date Noted   Enthesopathy of elbow region 10/28/2021   Inflammation of sacroiliac joint (HCC) 10/28/2021   Lateral epicondylitis 10/28/2021   Pain in elbow 10/28/2021   Primary localized osteoarthritis of pelvic region and thigh 10/28/2021   Wrist joint pain 10/28/2021   Neck pain 10/08/2020   Cervical spondylosis 06/25/2020   Mild neurocognitive disorder 08/01/2014   Abnormal MRI 04/20/2014   Bipolar 1 disorder (HCC) 04/20/2014   Depression 04/20/2014   Insomnia 04/20/2014    Past Surgical History:  Procedure Laterality Date   ABDOMINOPLASTY     BACK SURGERY     BREAST REDUCTION SURGERY     GASTRIC BYPASS     2006    OB History   No obstetric history on file.      Home Medications    Prior to Admission medications   Medication Sig Start Date End Date Taking? Authorizing Provider  acyclovir ointment (ZOVIRAX) 5 %  11/07/09   [provider]  atorvastatin (LIPITOR) 10 MG tablet  10/26/09   [provider]  atorvastatin (LIPITOR) 40 MG tablet Take 40 mg by mouth daily.    [provider]  atorvastatin (LIPITOR) 80 MG tablet atorvastatin 80 mg tablet  TAKE 1 TABLET BY MOUTH DAILYAT BEDTIME    [provider]  benzonatate (TESSALON) 100 MG capsule benzonatate 100 mg capsule    [provider]  bimatoprost (LATISSE) 0.03 % ophthalmic solution bimatoprost 0.03 % drops with applicator, eyelash base  APPLY TO UPPER LASHLINE AT BEDTIME AS DIRECTED.    [provider]  brexpiprazole (REXULTI) 1 MG TABS tablet Rexulti 1 mg tablet  TAKE 1 TABLET BY MOUTH EVERY DAY    [provider]  calcium-vitamin D (OSCAL WITH D) 250-125 MG-UNIT per tablet Take 1 tablet by mouth daily.    [provider]  CAPLYTA 42 MG capsule Take 42 mg by mouth every morning. 09/02/21   [provider]  chlorpheniramine-HYDROcodone (TUSSIONEX) 10-8 MG/5ML SUER hydrocodone 10 mg-chlorpheniramine 8 mg/5 mL oral susp extend.rel 12hr    [provider]  dextroamphetamine (DEXTROSTAT) 10 MG tablet dextroamphetamine 10 mg tablet    [provider]  DULoxetine (CYMBALTA) 20 MG capsule duloxetine 20 mg capsule,delayed release    [provider]  DULoxetine (CYMBALTA) 30 MG capsule duloxetine 30 mg capsule,delayed release  TAKE 3  CAPSULES BY MOUTH ONCE A DAY    [provider]  DULoxetine (CYMBALTA) 60 MG capsule duloxetine 60 mg capsule,delayed release    [provider]  esomeprazole (NEXIUM) 40 MG capsule Take by mouth.    [provider]  ferrous fumarate-b12-vitamic C-folic acid (TRINSICON / FOLTRIN) capsule Take by mouth. 12/09/06   [provider]  gabapentin (NEURONTIN) 100 MG capsule  10/06/16   [provider]  halobetasol (ULTRAVATE) 0.05 % cream halobetasol propionate 0.05 % topical cream    [provider]   lamoTRIgine (LAMICTAL) 100 MG tablet lamotrigine 100 mg tablet    [provider]  lamoTRIgine (LAMICTAL) 150 MG tablet Take 300 mg by mouth daily.    [provider]  LamoTRIgine 300 MG TB24 24 hour tablet Take by mouth. 09/05/11   [provider]  levothyroxine (SYNTHROID) 100 MCG tablet Synthroid 100 mcg tablet 09/08/16   [provider]  levothyroxine (SYNTHROID) 112 MCG tablet Synthroid 112 mcg tablet  TAKE 1 TABLET BY MOUTH EVERY DAY FOR 90 DAYS    [provider]  levothyroxine (SYNTHROID) 137 MCG tablet  10/08/09   [provider]  levothyroxine (SYNTHROID, LEVOTHROID) 125 MCG tablet Take 125 mcg by mouth daily before breakfast.    [provider]  lidocaine (LIDODERM) 5 % Place 1 patch onto the skin daily. Remove & Discard patch within 12 hours or as directed by MD 10/28/21   Raspet, Noberto Retort, PA-C  methocarbamol (ROBAXIN) 500 MG tablet Take 1 tablet (500 mg total) by mouth 2 (two) times daily as needed for muscle spasms. 10/28/21   Raspet, Noberto Retort, PA-C  mirtazapine (REMERON) 7.5 MG tablet mirtazapine 7.5 mg tablet    [provider]  Multiple Vitamin (ONE DAILY) tablet Take 1 tablet by mouth daily.    [provider]  naproxen (NAPROSYN) 500 MG tablet naproxen 500 mg tablet  1 TAB BY MOUTH 2 TIMES PER DAY AS NEEDED FOR PAIN    [provider]  predniSONE (DELTASONE) 10 MG tablet prednisone 10 mg tablet    [provider]  ranitidine (ZANTAC) 300 MG tablet  08/15/16   [provider]  sodium fluoride (PREVIDENT 5000 PLUS) 1.1 % CREA dental cream Denta 5000 Plus 1.1 % cream    [provider]  Teriparatide, Recombinant, (FORTEO) 600 MCG/2.4ML SOPN  05/27/16   [provider]  topiramate (TOPAMAX) 50 MG tablet topiramate 50 mg tablet  TAKE 1 TABLET BY MOUTH DAILY FOR 1 WEEK, THEN INCREASE TO TWICE DAILY    [provider]  traZODone (DESYREL) 50 MG tablet TAKE 3  TABLET AT BEDTIME PLUS 1 TAB AS NEEDED FOR INSOMNIA (MAX 200MG /24HRS) 07/22/20   [provider]  UNABLE TO FIND methylprednisolone 4 mg tablets in a dose pack  TAPER AS DIRECTED    [provider]  venlafaxine XR (EFFEXOR-XR) 150 MG 24 hr capsule Take by mouth. 01/16/14   [provider]  venlafaxine XR (EFFEXOR-XR) 37.5 MG 24 hr capsule  04/18/14   [provider]  vortioxetine HBr (TRINTELLIX) 10 MG TABS tablet Trintellix 10 mg tablet 11/11/17   [provider]  XANAX 0.5 MG tablet Take 0.5 mg by mouth daily as needed. 07/22/21   [provider]  albuterol (VENTOLIN HFA) 108 (90 Base) MCG/ACT inhaler Inhale 2 puffs into the lungs every 6 (six) hours as needed for wheezing or shortness of breath. 12/20/19 04/02/20  Renford Dills, NP    Family  History Family History  Problem Relation Age of Onset   Cancer Mother    Depression Mother    Cancer Father    Depression Father    ADD / ADHD Father     Social History Social History   Tobacco Use   Smoking status: Former    Current packs/day: 0.00    Types: Cigarettes    Quit date: 06/15/1995    Years since quitting: 28.3   Smokeless tobacco: Never  Vaping Use   Vaping status: Never Used  Substance Use Topics   Alcohol use: Yes    Comment: socially   Drug use: No     Allergies   Codeine, Morphine, Morphine and codeine, and Other   Review of Systems Review of Systems :negative unless otherwise stated in HPI.      Physical Exam Triage Vital Signs ED Triage Vitals  Encounter Vitals Group     BP      Systolic BP Percentile      Diastolic BP Percentile      Pulse      Resp      Temp      Temp src      SpO2      Weight      Height      Head Circumference      Peak Flow      Pain Score      Pain Loc      Pain Education      Exclude from Growth Chart    No data found.  Updated Vital Signs There were no vitals taken for this visit.  Visual Acuity Right Eye Distance:    Left Eye Distance:   Bilateral Distance:    Right Eye Near:   Left Eye Near:    Bilateral Near:     Physical Exam  GEN: alert, well appearing female, in no acute distress *** EYES: extra occular movements intact, no scleral injection*** CV: regular rate ***and rhythm RESP: no increased work of breathing, ***clear to ascultation bilaterally MSK: no extremity edema, no gross deformities NEURO: alert, moves all extremities appropriately, normal gait PSYCH: Normal affect, appropriate speech and behavior  SKIN: warm and dry; ***   ***pic  UC Treatments / Results  Labs (all labs ordered are listed, but only abnormal results are displayed) Labs Reviewed - No data to display  EKG   Radiology No results found.  Procedures Procedures (including critical care time)  Medications Ordered in UC Medications - No data to display  Initial Impression / Assessment and Plan / UC Course  I have reviewed the triage vital signs and the nursing notes.  Pertinent labs & imaging results that were available during my care of the patient were reviewed by me and considered in my medical decision making (see chart for details).     Pt is a 63 y.o. female who presents after injuring *** location at home*** just prior to arrival. ***Angulated laceration was repaired patient tolerated procedure well.  Pt received *** sutures.  Home care instructions provided. OTC analgesics as needed for pain.  Tetanus is UTD. Pt's last tentaus was 06/14/20.   *** document procedure    Final Clinical Impressions(s) / UC Diagnoses   Final diagnoses:  None   Discharge Instructions   None    ED Prescriptions   None    PDMP not reviewed this encounter.

## 2024-03-18 ENCOUNTER — Other Ambulatory Visit: Payer: Self-pay | Admitting: Medical Genetics

## 2024-04-02 ENCOUNTER — Other Ambulatory Visit
Admission: RE | Admit: 2024-04-02 | Discharge: 2024-04-02 | Disposition: A | Discharge status: Home / Self Care | Payer: Self-pay | Source: Ambulatory Visit | Attending: Medical Genetics | Admitting: Medical Genetics

## 2024-04-15 LAB — GENECONNECT MOLECULAR SCREEN: Genetic Analysis Overall Interpretation: NEGATIVE

## 2024-04-25 ENCOUNTER — Ambulatory Visit: Admitting: Urology

## 2024-05-23 ENCOUNTER — Ambulatory Visit: Admitting: Urology

## 2024-05-23 VITALS — BP 134/78 | HR 74 | Ht 66.0 in | Wt 135.0 lb

## 2024-05-23 DIAGNOSIS — N3941 Urge incontinence: Secondary | ICD-10-CM

## 2024-05-23 LAB — URINALYSIS, COMPLETE
Bilirubin, UA: NEGATIVE
Glucose, UA: NEGATIVE
Ketones, UA: NEGATIVE
Nitrite, UA: NEGATIVE
Protein,UA: NEGATIVE
Specific Gravity, UA: 1.01 (ref 1.005–1.030)
Urobilinogen, Ur: 0.2 mg/dL (ref 0.2–1.0)
pH, UA: 6 (ref 5.0–7.5)

## 2024-05-23 LAB — MICROSCOPIC EXAMINATION

## 2024-05-23 MED ORDER — GEMTESA 75 MG PO TABS: 1.0000 | ORAL_TABLET | Freq: Every day | ORAL | 11 refills | Status: AC

## 2024-05-23 NOTE — Progress Notes (Signed)
 05/23/2024 2:30 PM   Summer Ware [birth year] [id number]  Referring provider: Silvestre Samule HERO, MD [address] [place],  [place] [address]  Chief Complaint  Patient presents with   New Patient (Initial Visit)   Establish Care   Urinary Incontinence    HPI: I was consulted to assess the patient's urinary incontinence.  She can gashed with almost no warning and with her urgency.  She might leak with bending lifting but not coughing sneezing.  She can have high-volume bedwetting nearly every night  She changes her underwear pad twice a day and sometimes it can be very wet.  She voids every hour but could probably hold it for 2 hours.  She gets up twice at night.  Flow was good  She has had 2 low back operations and no hysterectomy  No history of kidney stones bladder surgery or bladder infections.  No treatment   PMH: Past Medical History:  Diagnosis Date   Depression    GERD (gastroesophageal reflux disease)    Hypercholesteremia    Hypothyroidism     Surgical History: Past Surgical History:  Procedure Laterality Date   ABDOMINOPLASTY     BACK SURGERY     BREAST REDUCTION SURGERY     GASTRIC BYPASS     2006    Home Medications:  Allergies as of 05/23/2024       Reactions   Codeine Anaphylaxis, Itching, Swelling, Other (See Comments)   Morphine Anaphylaxis, Swelling   Morphine And Codeine Anaphylaxis   Other Other (See Comments)   CIPRO: Severe joint pain Other reaction(s): Muscle Pain Makes pt joints hurt   Ciprofloxacin Other (See Comments)   Severe joint pain Other reaction(s): Muscle Pain Makes pt joints hurt   Gabapentin Other (See Comments)   Difficulty speaking        Medication List        Accurate as of May 23, 2024  2:30 PM. If you have any questions, ask your nurse or doctor.          STOP taking these medications    acyclovir ointment 5 % Commonly known as: ZOVIRAX   amoxicillin -clavulanate 875-125 MG  tablet Commonly known as: AUGMENTIN    Caplyta 42 MG capsule Generic drug: lumateperone tosylate   dextroamphetamine 10 MG tablet Commonly known as: DEXTROSTAT   DULoxetine 20 MG capsule Commonly known as: CYMBALTA   DULoxetine 30 MG capsule Commonly known as: CYMBALTA   DULoxetine 60 MG capsule Commonly known as: CYMBALTA   ferrous fumarate-b12-vitamic C-folic acid capsule Commonly known as: TRINSICON / FOLTRIN       TAKE these medications    atorvastatin 80 MG tablet Commonly known as: LIPITOR atorvastatin 80 mg tablet  TAKE 1 TABLET BY MOUTH DAILYAT BEDTIME What changed: Another medication with the same name was removed. Continue taking this medication, and follow the directions you see here.   bimatoprost 0.03 % ophthalmic solution Commonly known as: LATISSE bimatoprost 0.03 % drops with applicator, eyelash base  APPLY TO UPPER LASHLINE AT BEDTIME AS DIRECTED.   brexpiprazole 1 MG Tabs tablet Commonly known as: REXULTI Rexulti 1 mg tablet  TAKE 1 TABLET BY MOUTH EVERY DAY   calcium-vitamin D 250-125 MG-UNIT tablet Commonly known as: OSCAL WITH D Take 1 tablet by mouth daily.   esomeprazole 40 MG capsule Commonly known as: NEXIUM Take by mouth.   Forteo 600 MCG/2.4ML Sopn Generic drug: Teriparatide   gabapentin 100 MG capsule Commonly known as: NEURONTIN   halobetasol  0.05 % cream Commonly known as: ULTRAVATE halobetasol propionate 0.05 % topical cream   lamoTRIgine 150 MG tablet Commonly known as: LAMICTAL Take 300 mg by mouth daily.   lamoTRIgine 100 MG tablet Commonly known as: LAMICTAL lamotrigine 100 mg tablet   LamoTRIgine 300 MG Tb24 24 hour tablet Take by mouth.   levothyroxine 125 MCG tablet Commonly known as: SYNTHROID Take 125 mcg by mouth daily before breakfast.   levothyroxine 112 MCG tablet Commonly known as: SYNTHROID Synthroid 112 mcg tablet  TAKE 1 TABLET BY MOUTH EVERY DAY FOR 90 DAYS   levothyroxine 137 MCG  tablet Commonly known as: SYNTHROID   levothyroxine 100 MCG tablet Commonly known as: SYNTHROID Synthroid 100 mcg tablet   mirtazapine 7.5 MG tablet Commonly known as: REMERON mirtazapine 7.5 mg tablet   naproxen 500 MG tablet Commonly known as: NAPROSYN naproxen 500 mg tablet  1 TAB BY MOUTH 2 TIMES PER DAY AS NEEDED FOR PAIN   One Daily tablet Take 1 tablet by mouth daily.   ranitidine 300 MG tablet Commonly known as: ZANTAC   sodium fluoride 1.1 % Crea dental cream Commonly known as: PREVIDENT 5000 PLUS Denta 5000 Plus 1.1 % cream   topiramate 50 MG tablet Commonly known as: TOPAMAX topiramate 50 mg tablet  TAKE 1 TABLET BY MOUTH DAILY FOR 1 WEEK, THEN INCREASE TO TWICE DAILY   traZODone 50 MG tablet Commonly known as: DESYREL TAKE 3 TABLET AT BEDTIME PLUS 1 TAB AS NEEDED FOR INSOMNIA (MAX 200MG /24HRS)   UNABLE TO FIND methylprednisolone 4 mg tablets in a dose pack  TAPER AS DIRECTED   venlafaxine XR 150 MG 24 hr capsule Commonly known as: EFFEXOR-XR Take by mouth.   venlafaxine XR 37.5 MG 24 hr capsule Commonly known as: EFFEXOR-XR   vortioxetine HBr 10 MG Tabs tablet Commonly known as: TRINTELLIX Trintellix 10 mg tablet   Xanax 0.5 MG tablet Generic drug: ALPRAZolam Take 0.5 mg by mouth daily as needed.        Allergies:  Allergies  Allergen Reactions   Codeine Anaphylaxis, Itching, Swelling and Other (See Comments)   Morphine Anaphylaxis and Swelling   Morphine And Codeine Anaphylaxis   Other Other (See Comments)    CIPRO: Severe joint pain Other reaction(s): Muscle Pain Makes pt joints hurt   Ciprofloxacin Other (See Comments)    Severe joint pain  Other reaction(s): Muscle Pain  Makes pt joints hurt   Gabapentin Other (See Comments)    Difficulty speaking    Family History: Family History  Problem Relation Age of Onset   Cancer Mother    Depression Mother    Cancer Father    Depression Father    ADD / ADHD Father      Social History:  reports that she quit smoking about 28 years ago. She has never used smokeless tobacco. She reports current alcohol use. She reports that she does not use drugs.  ROS:                                        Physical Exam: BP 134/78 (BP Location: Left Arm, Patient Position: Sitting, Cuff Size: Normal)   Pulse 74   Ht 5' 6 (1.676 m)   Wt 61.2 kg   SpO2 97%   BMI 21.79 kg/m   Constitutional:  Alert and oriented, No acute distress. HEENT: [place] AT, moist mucus membranes.  Trachea  midline, no masses.   Laboratory Data: Lab Results  Component Value Date   WBC 8.2 11/11/2014   HGB 13.3 11/11/2014   HCT 39.3 11/11/2014   MCV 92 11/11/2014   PLT 253 11/11/2014    Lab Results  Component Value Date   CREATININE 0.79 11/11/2014    No results found for: PSA  No results found for: TESTOSTERONE  No results found for: HGBA1C  Urinalysis    Component Value Date/Time   COLORURINE YELLOW 10/28/2021 1926   APPEARANCEUR CLEAR 10/28/2021 1926   LABSPEC 1.015 10/28/2021 1926   PHURINE 5.5 10/28/2021 1926   GLUCOSEU NEGATIVE 10/28/2021 1926   HGBUR NEGATIVE 10/28/2021 1926   BILIRUBINUR NEGATIVE 10/28/2021 1926   KETONESUR TRACE (A) 10/28/2021 1926   PROTEINUR NEGATIVE 10/28/2021 1926   NITRITE NEGATIVE 10/28/2021 1926   LEUKOCYTESUR NEGATIVE 10/28/2021 1926    Pertinent Imaging: Urine reviewed and sent for culture.  Chart reviewed.  Assessment & Plan: Patient has mixed incontinence but primarily has urge incontinence and bedwetting.  She understands she may need a test in the future.  She will come back on Gemtesa samples and prescription for pelvic examination and cystoscopy.  Call if culture positive.  1. Urge incontinence (Primary)  - Urinalysis, Complete   No follow-ups on file.  Glendia DELENA Elizabeth, MD  [facility] [address] [place], [place] [address] [phone]

## 2024-05-23 NOTE — Patient Instructions (Signed)

## 2024-05-27 ENCOUNTER — Telehealth: Payer: Self-pay

## 2024-05-27 NOTE — Telephone Encounter (Signed)
 Spoke with patient and informed her Gemtesa samples will be at the front desk for pick up however she stated she already has her prescription filled and no longer need the samples.

## 2024-05-30 ENCOUNTER — Ambulatory Visit: Admitting: Urology

## 2024-05-31 ENCOUNTER — Ambulatory Visit: Payer: Self-pay

## 2024-05-31 DIAGNOSIS — N3941 Urge incontinence: Secondary | ICD-10-CM

## 2024-05-31 LAB — CULTURE, URINE COMPREHENSIVE

## 2024-05-31 MED ORDER — NITROFURANTOIN MACROCRYSTAL 100 MG PO CAPS: 100.0000 mg | ORAL_CAPSULE | Freq: Two times a day (BID) | ORAL | 0 refills | Status: AC

## 2024-08-22 ENCOUNTER — Other Ambulatory Visit: Admitting: Urology
